# Patient Record
Sex: Female | Born: 1990 | Race: Black or African American | Hispanic: No | Marital: Single | Smoking: Never smoker
Health system: Southern US, Community
[De-identification: ages and names within clinical notes are randomized; demographics above are authoritative.]

## PROBLEM LIST (undated history)

## (undated) DIAGNOSIS — M1990 Unspecified osteoarthritis, unspecified site: Secondary | ICD-10-CM

## (undated) DIAGNOSIS — J189 Pneumonia, unspecified organism: Secondary | ICD-10-CM

## (undated) DIAGNOSIS — Z8739 Personal history of other diseases of the musculoskeletal system and connective tissue: Secondary | ICD-10-CM

## (undated) DIAGNOSIS — Z789 Other specified health status: Secondary | ICD-10-CM

## (undated) DIAGNOSIS — L732 Hidradenitis suppurativa: Secondary | ICD-10-CM

## (undated) DIAGNOSIS — G44209 Tension-type headache, unspecified, not intractable: Secondary | ICD-10-CM

## (undated) DIAGNOSIS — L709 Acne, unspecified: Secondary | ICD-10-CM

## (undated) HISTORY — DX: Personal history of other diseases of the musculoskeletal system and connective tissue: Z87.39

## (undated) HISTORY — DX: Tension-type headache, unspecified, not intractable: G44.209

## (undated) HISTORY — DX: Hidradenitis suppurativa: L73.2

## (undated) HISTORY — DX: Acne, unspecified: L70.9

---

## 2008-02-04 ENCOUNTER — Ambulatory Visit: Payer: Self-pay | Admitting: Family Medicine

## 2008-02-04 ENCOUNTER — Encounter (INDEPENDENT_AMBULATORY_CARE_PROVIDER_SITE_OTHER): Payer: Self-pay | Admitting: Nurse Practitioner

## 2008-02-04 LAB — CONVERTED CEMR LAB
ALT: 18 units/L (ref 0–35)
AST: 24 units/L (ref 0–37)
Albumin: 4.2 g/dL (ref 3.5–5.2)
Alkaline Phosphatase: 76 units/L (ref 39–117)
BUN: 10 mg/dL (ref 6–23)
Basophils Absolute: 0 10*3/uL (ref 0.0–0.1)
Basophils Relative: 0 % (ref 0–1)
CO2: 24 meq/L (ref 19–32)
Calcium: 9.5 mg/dL (ref 8.4–10.5)
Chloride: 103 meq/L (ref 96–112)
Creatinine, Ser: 0.74 mg/dL (ref 0.40–1.20)
Eosinophils Absolute: 0.2 10*3/uL (ref 0.0–0.7)
Eosinophils Relative: 3 % (ref 0–5)
Glucose, Bld: 79 mg/dL (ref 70–99)
HCT: 38 % (ref 36.0–46.0)
Hemoglobin: 11.9 g/dL — ABNORMAL LOW (ref 12.0–15.0)
Lymphocytes Relative: 28 % (ref 12–46)
Lymphs Abs: 2.5 10*3/uL (ref 0.7–4.0)
MCHC: 31.3 g/dL (ref 30.0–36.0)
MCV: 82.4 fL (ref 78.0–100.0)
Monocytes Absolute: 1 10*3/uL (ref 0.1–1.0)
Monocytes Relative: 11 % (ref 3–12)
Neutro Abs: 5.3 10*3/uL (ref 1.7–7.7)
Neutrophils Relative %: 58 % (ref 43–77)
Platelets: 485 10*3/uL — ABNORMAL HIGH (ref 150–400)
Potassium: 4.4 meq/L (ref 3.5–5.3)
RBC: 4.61 M/uL (ref 3.87–5.11)
RDW: 14.6 % (ref 11.5–15.5)
Sodium: 140 meq/L (ref 135–145)
TSH: 0.659 microintl units/mL (ref 0.350–5.50)
Total Bilirubin: 0.4 mg/dL (ref 0.3–1.2)
Total Protein: 7.9 g/dL (ref 6.0–8.3)
WBC: 9.1 10*3/uL (ref 4.0–10.5)

## 2008-10-07 ENCOUNTER — Emergency Department (HOSPITAL_COMMUNITY): Admission: EM | Admit: 2008-10-07 | Discharge: 2008-10-07 | Payer: Self-pay | Admitting: Emergency Medicine

## 2009-05-11 ENCOUNTER — Emergency Department (HOSPITAL_COMMUNITY): Admission: EM | Admit: 2009-05-11 | Discharge: 2009-05-11 | Payer: Self-pay | Admitting: Emergency Medicine

## 2009-07-28 ENCOUNTER — Inpatient Hospital Stay (HOSPITAL_COMMUNITY): Admission: AD | Admit: 2009-07-28 | Discharge: 2009-07-29 | Payer: Self-pay | Admitting: Obstetrics & Gynecology

## 2010-05-01 ENCOUNTER — Inpatient Hospital Stay (HOSPITAL_COMMUNITY): Admission: AD | Admit: 2010-05-01 | Discharge: 2010-05-02 | Payer: Self-pay | Admitting: Obstetrics & Gynecology

## 2010-05-01 ENCOUNTER — Ambulatory Visit: Payer: Self-pay | Admitting: Obstetrics and Gynecology

## 2010-05-27 ENCOUNTER — Inpatient Hospital Stay (HOSPITAL_COMMUNITY): Admission: AD | Admit: 2010-05-27 | Discharge: 2010-05-27 | Payer: Self-pay | Admitting: Family Medicine

## 2010-05-27 ENCOUNTER — Ambulatory Visit: Payer: Self-pay | Admitting: Nurse Practitioner

## 2010-12-01 ENCOUNTER — Inpatient Hospital Stay (HOSPITAL_COMMUNITY)
Admission: AD | Admit: 2010-12-01 | Discharge: 2010-12-03 | Payer: Self-pay | Source: Home / Self Care | Attending: Obstetrics | Admitting: Obstetrics

## 2010-12-01 ENCOUNTER — Encounter (INDEPENDENT_AMBULATORY_CARE_PROVIDER_SITE_OTHER): Payer: Self-pay | Admitting: Obstetrics

## 2010-12-10 LAB — RPR: RPR Ser Ql: NONREACTIVE

## 2010-12-10 LAB — CBC
HCT: 32.1 % — ABNORMAL LOW (ref 36.0–46.0)
HCT: 26.4 % — ABNORMAL LOW (ref 36.0–46.0)
Hemoglobin: 10.7 g/dL — ABNORMAL LOW (ref 12.0–15.0)
Hemoglobin: 8.5 g/dL — ABNORMAL LOW (ref 12.0–15.0)
MCH: 25.3 pg — ABNORMAL LOW (ref 26.0–34.0)
MCH: 24.6 pg — ABNORMAL LOW (ref 26.0–34.0)
MCHC: 33.3 g/dL (ref 30.0–36.0)
MCHC: 31.8 g/dL (ref 30.0–36.0)
MCV: 75.9 fL — ABNORMAL LOW (ref 78.0–100.0)
MCV: 77.4 fL — ABNORMAL LOW (ref 78.0–100.0)
Platelets: 373 10*3/uL (ref 150–400)
Platelets: 315 10*3/uL (ref 150–400)
RBC: 4.23 MIL/uL (ref 3.87–5.11)
RBC: 3.41 MIL/uL — ABNORMAL LOW (ref 3.87–5.11)
RDW: 15.3 % (ref 11.5–15.5)
RDW: 15.4 % (ref 11.5–15.5)
WBC: 12.7 10*3/uL — ABNORMAL HIGH (ref 4.0–10.5)
WBC: 12.6 10*3/uL — ABNORMAL HIGH (ref 4.0–10.5)

## 2011-02-10 LAB — WET PREP, GENITAL
Clue Cells Wet Prep HPF POC: NONE SEEN
Trich, Wet Prep: NONE SEEN

## 2011-02-11 LAB — POCT PREGNANCY, URINE: Preg Test, Ur: POSITIVE

## 2011-03-01 LAB — WET PREP, GENITAL: Trich, Wet Prep: NONE SEEN

## 2011-03-01 LAB — GC/CHLAMYDIA PROBE AMP, GENITAL
Chlamydia, DNA Probe: NEGATIVE
GC Probe Amp, Genital: NEGATIVE

## 2011-03-04 LAB — POCT URINALYSIS DIP (DEVICE)
Bilirubin Urine: NEGATIVE
Glucose, UA: NEGATIVE mg/dL
Hgb urine dipstick: NEGATIVE
Ketones, ur: NEGATIVE mg/dL
Nitrite: POSITIVE — AB
Protein, ur: 30 mg/dL — AB
Specific Gravity, Urine: 1.025 (ref 1.005–1.030)
Urobilinogen, UA: 0.2 mg/dL (ref 0.0–1.0)
pH: 6 (ref 5.0–8.0)

## 2011-03-04 LAB — POCT PREGNANCY, URINE: Preg Test, Ur: NEGATIVE

## 2011-03-13 NOTE — Discharge Summary (Signed)
  NAME:  Kari Rodriguez, Kari Rodriguez           ACCOUNT NO.:  [id number]  MEDICAL RECORD NO.:  [id number]          PATIENT TYPE:  INP  LOCATION:  9108                          FACILITY:  [facility]  PHYSICIAN:  Kathreen Cosier, M.D.DATE OF BIRTH:  08/20/1991  DATE OF ADMISSION:  12/01/2010 DATE OF DISCHARGE:  12/03/2010                              DISCHARGE SUMMARY   The patient is a 20 year old primigravida, [facility] December 15, 2010, who was admitted on December 01, 2010, because of leaking membranes, contracting, and also breech presentation.  She underwent primary low transverse cesarean section and had a female, Apgars 9 and 9, weighing 6 pounds 11 ounces.  On day 1, the patient was doing fine.  On day 2, she was doing fine.  Her hemoglobin was 8.1.  Her white count 12.6, platelets 215. RPR nonreactive.  HIV negative.  The patient desired early discharge and was discharged on the second postoperative day on ferrous sulfate for anemia, Tylox for pain, to see me in 6 weeks.  DISCHARGE DIAGNOSIS:  Status post primary low transverse cesarean section at term because of breech presentation with ruptured membranes in active labor.          ______________________________ Kathreen Cosier, M.D.     BAM/MEDQ  D:  03/06/2011  T:  03/06/2011  Job:  [id number]  Electronically Signed by Francoise Ceo M.D. on 03/13/2011 07:11:03 AM

## 2012-07-16 ENCOUNTER — Encounter (HOSPITAL_COMMUNITY): Payer: Self-pay | Admitting: Emergency Medicine

## 2012-07-16 ENCOUNTER — Emergency Department (HOSPITAL_COMMUNITY)
Admission: EM | Admit: 2012-07-16 | Discharge: 2012-07-16 | Disposition: A | Discharge status: Home / Self Care | Payer: Self-pay | Attending: Emergency Medicine | Admitting: Emergency Medicine

## 2012-07-16 DIAGNOSIS — M549 Dorsalgia, unspecified: Secondary | ICD-10-CM | POA: Insufficient documentation

## 2012-07-16 DIAGNOSIS — S39012A Strain of muscle, fascia and tendon of lower back, initial encounter: Secondary | ICD-10-CM

## 2012-07-16 LAB — PREGNANCY, URINE: Preg Test, Ur: NEGATIVE

## 2012-07-16 LAB — URINALYSIS, ROUTINE W REFLEX MICROSCOPIC
Bilirubin Urine: NEGATIVE
Glucose, UA: NEGATIVE mg/dL
Hgb urine dipstick: NEGATIVE
Ketones, ur: NEGATIVE mg/dL
Leukocytes, UA: NEGATIVE
Nitrite: NEGATIVE
Protein, ur: NEGATIVE mg/dL
Specific Gravity, Urine: 1.014 (ref 1.005–1.030)
Urobilinogen, UA: 1 mg/dL (ref 0.0–1.0)
pH: 7 (ref 5.0–8.0)

## 2012-07-16 MED ORDER — IBUPROFEN 800 MG PO TABS: 800.0000 mg | ORAL_TABLET | Freq: Three times a day (TID) | ORAL | Status: DC

## 2012-07-16 MED ORDER — IBUPROFEN 400 MG PO TABS
800.0000 mg | ORAL_TABLET | Freq: Once | ORAL | Status: AC
  Administered 2012-07-16: 800 mg via ORAL
  Filled 2012-07-16: qty 2

## 2012-07-16 NOTE — ED Notes (Signed)
Pt c/o lower back pain x 2 weeks on right side; pt denies obvious injury

## 2012-07-16 NOTE — ED Provider Notes (Signed)
History  This chart was scribed for Glynn Octave, MD by Bennett Scrape. This patient was seen in room TR07C/TR07C and the patient's care was started at 11:59AM.  CSN: [id number]  Arrival date & time 07/16/12  1147   First MD Initiated Contact with Patient 07/16/12 1159      Chief Complaint  Patient presents with  . Back Pain    The history is provided by the patient. No language interpreter was used.    Kari Rodriguez is a 21 y.o. female who presents to the Emergency Department complaining of 2 weeks of gradual onset, non-changing, constant right lower back pain described as sharp. The pain is non-radiating and is worse with sudden movement and changes in positon. She denies any recent falls or injuries but reports that she does a fair amount of heavy lifting daily in picking up her child. She reports taking Doan's pain reliever and Bengay with mild  improvement in symptoms. She denies any urinary symptoms, abdominal pain, weakness, numbness, loss of bowels or bladder and CP as associated symptoms. she does not have a h/o chronic medical conditions. She denies smoking and alcohol use.     History reviewed. No pertinent past medical history.  History reviewed. No pertinent past surgical history.  History reviewed. No pertinent family history.  History  Substance Use Topics  . Smoking status: Never Smoker   . Smokeless tobacco: Not on file  . Alcohol Use: No    No OB history provided.  Review of Systems  A complete 10 system review of systems was obtained and all systems are negative except as noted in the HPI and PMH.   Allergies  Review of patient's allergies indicates no known allergies.  Home Medications   Current Outpatient Rx  Name Route Sig Dispense Refill  . NEXPLANON [place] Subcutaneous Inject into the skin once.    Marland Kitchen DOANS PILLS PO Oral Take 2 tablets by mouth once.      Triage Vitals: BP 147/77  Pulse 83  Temp 98.9 F (37.2 C) (Oral)  Resp 18  SpO2  100%  Physical Exam  Nursing note and vitals reviewed. Constitutional: She is oriented to person, place, and time. She appears well-developed and well-nourished. No distress.  HENT:  Head: Normocephalic and atraumatic.  Eyes: EOM are normal.  Neck: Neck supple. No tracheal deviation present.  Cardiovascular: Normal rate.   Pulmonary/Chest: Effort normal. No respiratory distress.  Abdominal: Soft. There is no tenderness.  Musculoskeletal: Normal range of motion.       Right para spinal tenderness to palpation, no midline tenderness, 5/5 strength, 2+ patellar reflexes bilaterally, ankle, dorsi and plantar flexion intact, great toe extension intact bilaterally  Neurological: She is alert and oriented to person, place, and time.  Skin: Skin is warm and dry.  Psychiatric: She has a normal mood and affect. Her behavior is normal.    ED Course  Procedures (including critical care time)  DIAGNOSTIC STUDIES: Oxygen Saturation is 100% on room air, normal by my interpretation.    COORDINATION OF CARE: 12:09PM-Discussed discharge plan which includes ibuprofen and motrin as needed with pt at bedside and pt agreed to plan. Also advised pt to use ice and heat as needed to help improve symptoms.  1:12PM-Informed pt of negative lab results and pt acknowledged these results. Discussed discharge plan with pt and pt agreed. Advised pt to avoid lifting more than10 lbs but to continue doing her daily activities.    Labs Reviewed  URINALYSIS, ROUTINE  W REFLEX MICROSCOPIC  PREGNANCY, URINE   No results found.   No diagnosis found.    MDM  Right paraspinal back pain that does not radiate. No midline tenderness, no neurological deficits or red flags.  Urinalysis negative, hCG negative. Suspect muscular skeletal strain from lifting injury. No neuro deficits or red flags, stable for followup with PCP.  I personally performed the services described in this documentation, which was scribed in my  presence.  The recorded information has been reviewed and considered.       Glynn Octave, MD 07/16/12 1322

## 2012-07-16 NOTE — ED Notes (Signed)
Pt reports no lifting injury or fall.  Pt states she has pain in lumbar area and right flank area that has been present for approximately 2 weeks

## 2012-07-25 ENCOUNTER — Emergency Department (HOSPITAL_COMMUNITY)
Admission: EM | Admit: 2012-07-25 | Discharge: 2012-07-25 | Disposition: A | Discharge status: Home / Self Care | Payer: Self-pay | Attending: Emergency Medicine | Admitting: Emergency Medicine

## 2012-07-25 ENCOUNTER — Encounter (HOSPITAL_COMMUNITY): Payer: Self-pay | Admitting: Emergency Medicine

## 2012-07-25 DIAGNOSIS — M549 Dorsalgia, unspecified: Secondary | ICD-10-CM | POA: Insufficient documentation

## 2012-07-25 MED ORDER — HYDROCODONE-ACETAMINOPHEN 5-325 MG PO TABS: 1.0000 | ORAL_TABLET | ORAL | Status: AC | PRN

## 2012-07-25 MED ORDER — IBUPROFEN 600 MG PO TABS: 600.0000 mg | ORAL_TABLET | Freq: Four times a day (QID) | ORAL | Status: AC | PRN

## 2012-07-25 MED ORDER — CYCLOBENZAPRINE HCL 10 MG PO TABS: 10.0000 mg | ORAL_TABLET | Freq: Two times a day (BID) | ORAL | Status: AC | PRN

## 2012-07-25 MED ORDER — IBUPROFEN 400 MG PO TABS
600.0000 mg | ORAL_TABLET | Freq: Once | ORAL | Status: AC
  Administered 2012-07-25: 600 mg via ORAL
  Filled 2012-07-25: qty 1

## 2012-07-25 NOTE — ED Notes (Signed)
Pt c/o back pain ongoing since Aug 12th. Pt seen here last week for same and given ibuprofen without relief. Pt denies urinary symptoms.

## 2012-07-25 NOTE — ED Provider Notes (Signed)
History  This chart was scribed for Derwood Kaplan, MD by Erskine Emery. This patient was seen in room TR04C/TR04C and the patient's care was started at 12:30.    CSN: [id number]  Arrival date & time 07/25/12  1105   None     Chief Complaint  Patient presents with  . Back Pain    (Consider location/radiation/quality/duration/timing/severity/associated sxs/prior treatment) The history is provided by the patient. No language interpreter was used.  Kari Rodriguez is a 21 y.o. female who presents to the Emergency Department complaining of gradually worsening back pain in the middle low back with no radiation for the past 2 weeks (since August 12th). Pt denies any known injury to the area or h/o back pain. Pt reports the pain is aggravated by turning and moving, creating a sharp pain, and only mildly relieved by her prescribed 800 mg of Ibuprofen. Pt reports taking a family member's muscle relaxer last night on top of her Ibuprofen with no relief from pain. Pt denies any urinary difficulty, dysuria, or numbness and tingling in her legs, but does report occasional hand numbness when writing.  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 Pt drove herself here.   History reviewed. No pertinent past medical history.  Past Surgical History  Procedure Date  . Cesarean section     History reviewed. No pertinent family history.  History  Substance Use Topics  . Smoking status: Never Smoker   . Smokeless tobacco: Not on file  . Alcohol Use: No    OB History    Grav Para Term Preterm Abortions TAB SAB Ect Mult Living                  Review of Systems  Constitutional: Negative for fever and chills.  Respiratory: Negative for shortness of breath.   Gastrointestinal: Negative for nausea and vomiting.  Genitourinary: Negative for dysuria and difficulty urinating.  Musculoskeletal: Positive for back pain.  Neurological: Negative for weakness and numbness.     Allergies  Review of patient's allergies indicates no known allergies.  Home Medications   Current Outpatient Rx  Name Route Sig Dispense Refill  . CYCLOBENZAPRINE HCL 10 MG PO TABS Oral Take 10 mg by mouth once as needed. For pain    . NEXPLANON [place] Subcutaneous Inject into the skin once.    . IBUPROFEN 800 MG PO TABS Oral Take 800 mg by mouth 3 (three) times daily.      BP 124/75  Pulse 67  Temp 98.8 F (37.1 C) (Oral)  Resp 18  SpO2 97%  Physical Exam  Nursing note and vitals reviewed. Constitutional: She is oriented to person, place, and time. She appears well-developed and well-nourished. No distress.  HENT:  Head: Normocephalic and atraumatic.  Eyes: EOM are normal.  Neck: Neck supple. No tracheal deviation present.  Cardiovascular: Normal rate, regular rhythm and normal heart sounds.   Pulmonary/Chest: Effort normal and breath sounds normal. No respiratory distress. She has no wheezes.       Clear to auscultation  Abdominal: Soft. There is no tenderness.  Musculoskeletal: Normal range of motion.       Tender from L3 to L5/S1 with mild spasming present. No CVA tenderness. No tenderness on the sides. Positive straight leg raise on the right lower extremity.   Neurological: She is alert and oriented to person, place, and time.  Skin: Skin is warm and dry.  Psychiatric: She has a normal mood and  affect. Her behavior is normal.    ED Course  Procedures (including critical care time) DIAGNOSTIC STUDIES: Oxygen Saturation is 97% on room air, adequate by my interpretation.    COORDINATION OF CARE: 12: 30--I evaluated the patient and we discussed a treatment plan including Ibuprofen (600 mg), Norco, and muscle relaxers to which the pt agreed. I suggested the pt alternate using ice and heat, be careful when lifting anything, and pay attention to her posture.   Labs Reviewed - No data to display No results found.   No diagnosis found.    MDM   DDx includes: -  DJD of the back - Spondylitises/ spondylosis - Sciatica - Musculoskeletal pain - Muscle spasms  Young woman, with back pain. No associated numbness, weakness, urinary incontinence, urinary retention, bowel incontinence, weakness. No risk factors for serious conditions in the spinal cord or lytic lesions of the back. Will give pain meds.      Medical screening examination/treatment/procedure(s) were conducted by me as a physician. Scribe utilized for documentation purposes only.   Derwood Kaplan, MD 07/25/12 1259

## 2012-12-28 ENCOUNTER — Encounter (HOSPITAL_COMMUNITY): Payer: Self-pay

## 2012-12-28 ENCOUNTER — Emergency Department (HOSPITAL_COMMUNITY)
Admission: EM | Admit: 2012-12-28 | Discharge: 2012-12-28 | Disposition: A | Discharge status: Home / Self Care | Payer: BC Managed Care – PPO | Source: Home / Self Care

## 2012-12-28 DIAGNOSIS — S50869A Insect bite (nonvenomous) of unspecified forearm, initial encounter: Secondary | ICD-10-CM

## 2012-12-28 DIAGNOSIS — IMO0001 Reserved for inherently not codable concepts without codable children: Secondary | ICD-10-CM

## 2012-12-28 DIAGNOSIS — W57XXXA Bitten or stung by nonvenomous insect and other nonvenomous arthropods, initial encounter: Secondary | ICD-10-CM

## 2012-12-28 MED ORDER — PREDNISONE 20 MG PO TABS: ORAL_TABLET | ORAL | Status: DC

## 2012-12-28 MED ORDER — TRIAMCINOLONE ACETONIDE 0.1 % EX CREA: TOPICAL_CREAM | Freq: Two times a day (BID) | CUTANEOUS | Status: DC

## 2012-12-28 NOTE — ED Notes (Signed)
Patient c/o possible insect bite or rash on right arm and left leg, states started approx 2 weeks ago comes and goes, has tried Benadryl and Cortisone cream with some relief but the spots always return

## 2012-12-28 NOTE — ED Provider Notes (Signed)
History     CSN: [id number]  Arrival date & time 12/28/12  1820   None     Chief Complaint  Patient presents with  . Rash    (Consider location/radiation/quality/duration/timing/severity/associated sxs/prior treatment) HPI Comments: For the past couple of days this patient has noticed bumps on her body. It starts out as a small red raised area with moderate to severe itching. She was applied OTC cortisone cream and taken Benadryl in detail temporarily. Today she has only 2 lesions. The largest lesion is on the right forearm measuring 5 x 4 cm. This lesion is raised, red and mildly indurated. She denies systemic symptoms. Previous lesions have been smaller have resolved spontaneously.   History reviewed. No pertinent past medical history.  Past Surgical History  Procedure Date  . Cesarean section     No family history on file.  History  Substance Use Topics  . Smoking status: Never Smoker   . Smokeless tobacco: Not on file  . Alcohol Use: No    OB History    Grav Para Term Preterm Abortions TAB SAB Ect Mult Living                  Review of Systems  Constitutional: Negative.   Respiratory: Negative.   Cardiovascular: Negative.   Gastrointestinal: Negative.   Musculoskeletal: Negative.   Skin:       As per history of present illness    Allergies  Review of patient's allergies indicates no known allergies.  Home Medications   Current Outpatient Rx  Name  Route  Sig  Dispense  Refill  . NEXPLANON [place]   Subcutaneous   Inject into the skin once.         . CYCLOBENZAPRINE HCL 10 MG PO TABS   Oral   Take 10 mg by mouth once as needed. For pain         . PREDNISONE 20 MG PO TABS      2 tabs po once daily x 3 days   6 tablet   0   . TRIAMCINOLONE ACETONIDE 0.1 % EX CREA   Topical   Apply topically 2 (two) times daily.   30 g   0     BP 124/85  Pulse 78  Temp 98.6 F (37 C) (Oral)  Resp 16  SpO2 98%  Physical Exam  Constitutional: She is  oriented to person, place, and time. She appears well-developed and well-nourished. No distress.  HENT:  Mouth/Throat: Oropharynx is clear and moist.  Eyes: Conjunctivae normal and EOM are normal.  Neck: Normal range of motion. Neck supple.  Cardiovascular: Normal rate and normal heart sounds.   Pulmonary/Chest: Effort normal and breath sounds normal.  Musculoskeletal: She exhibits no edema and no tenderness.  Neurological: She is alert and oriented to person, place, and time.  Skin: Skin is warm and dry.       Lesion on the right forearm as described in history of present illness. Much smaller lesion on the left leg approximately 0.5 cm in diameter. There is no lymphangitis. Mildly increased warmth of the forearm.  Psychiatric: She has a normal mood and affect.    ED Course  Procedures (including critical care time)  Labs Reviewed - No data to display No results found.   1. Insect bite of forearm       MDM  Triamcinolone cream to the affected areas twice a day Prednisone 20 mg daily for 3 day Continue the Benadryl every  4-6 hours as needed for itching Or new symptoms problems or worsening may return.         Hayden Rasmussen, NP 12/28/12 2009

## 2012-12-29 NOTE — ED Provider Notes (Signed)
Medical screening examination/treatment/procedure(s) were performed by resident physician or non-physician practitioner and as supervising physician I was immediately available for consultation/collaboration.   Lovenia Debruler DOUGLAS MD.    Mandrell Vangilder D Wrenley Sayed, MD 12/29/12 1307 

## 2013-04-27 ENCOUNTER — Other Ambulatory Visit (HOSPITAL_COMMUNITY)
Admission: RE | Admit: 2013-04-27 | Discharge: 2013-04-27 | Disposition: A | Discharge status: Home / Self Care | Payer: BC Managed Care – PPO | Source: Ambulatory Visit | Attending: Obstetrics and Gynecology | Admitting: Obstetrics and Gynecology

## 2013-04-27 ENCOUNTER — Other Ambulatory Visit: Payer: Self-pay | Admitting: Obstetrics and Gynecology

## 2013-04-27 DIAGNOSIS — Z113 Encounter for screening for infections with a predominantly sexual mode of transmission: Secondary | ICD-10-CM | POA: Insufficient documentation

## 2013-04-27 DIAGNOSIS — Z01419 Encounter for gynecological examination (general) (routine) without abnormal findings: Secondary | ICD-10-CM | POA: Insufficient documentation

## 2014-05-02 ENCOUNTER — Encounter (HOSPITAL_COMMUNITY): Payer: Self-pay | Admitting: Emergency Medicine

## 2014-05-02 ENCOUNTER — Emergency Department (HOSPITAL_COMMUNITY)
Admission: EM | Admit: 2014-05-02 | Discharge: 2014-05-02 | Disposition: A | Discharge status: Home / Self Care | Payer: BC Managed Care – PPO | Attending: Emergency Medicine | Admitting: Emergency Medicine

## 2014-05-02 DIAGNOSIS — IMO0002 Reserved for concepts with insufficient information to code with codable children: Secondary | ICD-10-CM | POA: Insufficient documentation

## 2014-05-02 DIAGNOSIS — L732 Hidradenitis suppurativa: Secondary | ICD-10-CM | POA: Insufficient documentation

## 2014-05-02 MED ORDER — SULFAMETHOXAZOLE-TRIMETHOPRIM 800-160 MG PO TABS: 2.0000 | ORAL_TABLET | Freq: Two times a day (BID) | ORAL | Status: DC

## 2014-05-02 NOTE — ED Notes (Signed)
Signature pad not working. Patient verbalizes discharge instructions.

## 2014-05-02 NOTE — ED Provider Notes (Signed)
CSN: [id number]     Arrival date & time 05/02/14  1452 History  This chart was scribed for non-physician practitioner, Renne CriglerJoshua Eimy Plaza, PA-C, working with Audree CamelScott T Goldston, MD by Shari HeritageAisha Amuda, ED Scribe. This patient was seen in room WTR5/WTR5 and the patient's care was started at 3:48 PM.   Chief Complaint  Patient presents with  . Abscess    The history is provided by the patient. No language interpreter was used.    HPI Comments: Kari Rodriguez is a 23 y.o. female who presents to the Emergency Department complaining of boils to both axilla for multiple months. Patient states that they have opened up and have been draining blood and foul smelling purulent drainage for the past 3 months. She has tried applying antibiotic creams and other OTC ointments to the area without symptom improvement. Patient has a history of recurrent skin infections and these also run in her family. She reports that her aunt had to have an abscess surgically removed due to diabetic related complications. Patient has no personal history of DM. She denies associated fever, chills, nausea, or vomiting. She has no other chronic medical conditions.   History reviewed. No pertinent past medical history. Past Surgical History  Procedure Laterality Date  . Cesarean section     No family history on file. History  Substance Use Topics  . Smoking status: Never Smoker   . Smokeless tobacco: Not on file  . Alcohol Use: No   OB History   Grav Para Term Preterm Abortions TAB SAB Ect Mult Living                 Review of Systems  Constitutional: Negative for fever and chills.  Gastrointestinal: Negative for nausea and vomiting.  Skin: Positive for wound (boils to bilateral axilla). Negative for color change.       Positive for abscess.  Hematological: Negative for adenopathy.    Allergies  Review of patient's allergies indicates no known allergies.  Home Medications   Prior to Admission medications    Medication Sig Start Date End Date Taking? Authorizing Provider  cyclobenzaprine (FLEXERIL) 10 MG tablet Take 10 mg by mouth once as needed. For pain    Historical Provider, MD  Etonogestrel [facility](NEXPLANON [place]) Inject into the skin once.    Historical Provider, MD  predniSONE (DELTASONE) 20 MG tablet 2 tabs po once daily x 3 days 12/28/12   Hayden Rasmussenavid Mabe, NP  triamcinolone cream (KENALOG) 0.1 % Apply topically 2 (two) times daily. 12/28/12   Hayden Rasmussenavid Mabe, NP    Physical Exam  Nursing note and vitals reviewed. Constitutional: She appears well-developed and well-nourished. No distress.  HENT:  Head: Normocephalic and atraumatic.  Eyes: Conjunctivae and EOM are normal.  Neck: Normal range of motion. No tracheal deviation present.  Cardiovascular: Normal rate.   Pulmonary/Chest: Effort normal. No respiratory distress.  Musculoskeletal: Normal range of motion.  Neurological: She is alert.  Skin: Skin is warm and dry.  Multiple very small abscesses to left and right axilla with minimal drainage. Small amount of pus expressed from the small abscess in right axilla. There are no large areas of fluctuance that required drainage today. Overall, clinical picture is consistent with hidradenitis suppurativa.   Psychiatric: She has a normal mood and affect. Her behavior is normal.    ED Course  Procedures (including critical care time) DIAGNOSTIC STUDIES: Oxygen Saturation is 100% on room air, normal by my interpretation.    COORDINATION OF CARE: 3:53 PM- Patient  informed of current plan for treatment and evaluation and agrees with plan at this time.  BP 131/77  Pulse 76  Temp(Src) 97.8 F (36.6 C) (Oral)  Resp 18  SpO2 100%  LMP 04/11/2014  Will treat patient with oral antibiotics. Discussed hidradenitis and that the symptoms could be recurrent and chronic. Recommend that she followup with Gen. surgery for further evaluation and management suggestions and advice.  The patient was urged to return to  the Emergency Department urgently with worsening pain, swelling, expanding erythema especially if it streaks away from the affected area, fever, or if they have any other concerns.   The patient was urged to return to the Emergency Department or go to their PCP in 48 hours for wound recheck if the area is not significantly improved.  The patient verbalized understanding and stated agreement with this plan.     MDM   Final diagnoses:  Hidradenitis suppurativa   Patient with symptoms x3 months. No previous diagnosis of hidradenitis however clinically her exam supports this. Feel course of antibiotics is warranted due to severity and duration of symptoms. Surgical followup given. No fever or other systemic symptoms.  I personally performed the services described in this documentation, which was scribed in my presence. The recorded information has been reviewed and is accurate.    Renne Crigler, PA-C 05/02/14 [phone]

## 2014-05-02 NOTE — Discharge Instructions (Signed)
Please read and follow all provided instructions.  Your diagnoses today include:  1. Hidradenitis suppurativa     Tests performed today include:  Vital signs. See below for your results today.   Medications prescribed:   Bactrim (trimethoprim/sulfamethoxazole) - antibiotic  You have been prescribed an antibiotic medicine: take the entire course of medicine even if you are feeling better. Stopping early can cause the antibiotic not to work.  Take any prescribed medications only as directed.   Home care instructions:   Follow any educational materials contained in this packet  Follow-up instructions: Please follow-up with your primary care provider or the surgeon listed in the next 1 week for further evaluation of your symptoms. If you do not have a primary care doctor -- see below for referral information.   Return instructions:  Return to the Emergency Department if you have:  Fever  Worsening symptoms  Worsening pain  Worsening swelling  Redness of the skin that moves away from the affected area, especially if it streaks away from the affected area   Any other emergent concerns  Your vital signs today were: BP 131/77   Pulse 76   Temp(Src) 97.8 F (36.6 C) (Oral)   Resp 18   SpO2 100%   LMP 04/11/2014 If your blood pressure (BP) was elevated above 135/85 this visit, please have this repeated by your doctor within one month. --------------

## 2014-05-02 NOTE — ED Notes (Signed)
Pt c/o bilateral abscess under both arms. Pt states they are open and draining blood and also have a bad smell.

## 2014-05-03 NOTE — ED Provider Notes (Signed)
Medical screening examination/treatment/procedure(s) were performed by non-physician practitioner and as supervising physician I was immediately available for consultation/collaboration.   EKG Interpretation None        Chace Klippel T Sinahi Knights, MD 05/03/14 0050 

## 2014-07-13 ENCOUNTER — Ambulatory Visit (INDEPENDENT_AMBULATORY_CARE_PROVIDER_SITE_OTHER): Payer: BC Managed Care – PPO | Admitting: General Surgery

## 2014-07-28 ENCOUNTER — Ambulatory Visit (INDEPENDENT_AMBULATORY_CARE_PROVIDER_SITE_OTHER): Payer: BC Managed Care – PPO | Admitting: General Surgery

## 2016-02-01 ENCOUNTER — Other Ambulatory Visit: Payer: Self-pay | Admitting: Nurse Practitioner

## 2016-02-01 ENCOUNTER — Other Ambulatory Visit (HOSPITAL_COMMUNITY)
Admission: RE | Admit: 2016-02-01 | Discharge: 2016-02-01 | Disposition: A | Discharge status: Home / Self Care | Payer: BLUE CROSS/BLUE SHIELD | Source: Ambulatory Visit | Attending: Nurse Practitioner | Admitting: Nurse Practitioner

## 2016-02-01 DIAGNOSIS — Z113 Encounter for screening for infections with a predominantly sexual mode of transmission: Secondary | ICD-10-CM | POA: Diagnosis present

## 2016-02-01 DIAGNOSIS — Z01419 Encounter for gynecological examination (general) (routine) without abnormal findings: Secondary | ICD-10-CM | POA: Insufficient documentation

## 2016-02-02 LAB — CYTOLOGY - PAP

## 2016-12-26 HISTORY — PX: OTHER SURGICAL HISTORY: SHX169

## 2017-01-17 ENCOUNTER — Other Ambulatory Visit: Payer: Self-pay | Admitting: General Surgery

## 2017-05-11 ENCOUNTER — Emergency Department (HOSPITAL_COMMUNITY)
Admission: EM | Admit: 2017-05-11 | Discharge: 2017-05-11 | Disposition: A | Discharge status: Home / Self Care | Payer: BLUE CROSS/BLUE SHIELD | Attending: Emergency Medicine | Admitting: Emergency Medicine

## 2017-05-11 ENCOUNTER — Encounter (HOSPITAL_COMMUNITY): Payer: Self-pay | Admitting: Emergency Medicine

## 2017-05-11 ENCOUNTER — Encounter (HOSPITAL_COMMUNITY): Payer: Self-pay | Admitting: Nurse Practitioner

## 2017-05-11 DIAGNOSIS — L02412 Cutaneous abscess of left axilla: Secondary | ICD-10-CM | POA: Insufficient documentation

## 2017-05-11 DIAGNOSIS — Z79899 Other long term (current) drug therapy: Secondary | ICD-10-CM | POA: Insufficient documentation

## 2017-05-11 DIAGNOSIS — Z5321 Procedure and treatment not carried out due to patient leaving prior to being seen by health care provider: Secondary | ICD-10-CM | POA: Insufficient documentation

## 2017-05-11 DIAGNOSIS — M7989 Other specified soft tissue disorders: Secondary | ICD-10-CM | POA: Diagnosis present

## 2017-05-11 MED ORDER — SULFAMETHOXAZOLE-TRIMETHOPRIM 800-160 MG PO TABS: 1.0000 | ORAL_TABLET | Freq: Two times a day (BID) | ORAL | 0 refills | Status: AC

## 2017-05-11 MED ORDER — LIDOCAINE-EPINEPHRINE (PF) 2 %-1:200000 IJ SOLN
10.0000 mL | Freq: Once | INTRAMUSCULAR | Status: AC
  Administered 2017-05-11: 10 mL
  Filled 2017-05-11: qty 20

## 2017-05-11 NOTE — ED Triage Notes (Signed)
Pt states she has a condition that causes her to get frequent abscess. She noticed a few days ago she began developing one in her left axillary that is now over the past 24hrs more painful and causing discomfort. Denies any fever or malaise. States her provider generally has to drain or do some form of surgical procedure on them at times.

## 2017-05-11 NOTE — ED Provider Notes (Signed)
[facility]-EMERGENCY DEPT Provider Note   CSN: [id number] Arrival date & time: 05/11/17  1134  By signing my name below, I, Kari Rodriguez, attest that this documentation has been prepared under the direction and in the presence of [employer], PA-C. Electronically Signed: Linna Rodriguez, Scribe. 05/11/2017. 12:40 PM.  History   Chief Complaint Chief Complaint  Patient presents with  . Recurrent Skin Infections   The history is provided by the patient. No language interpreter was used.    HPI Comments: Kari Rodriguez is a 26 y.o. female who presents to the Emergency Department complaining of a moderate, gradually worsening area of pain and swelling to her left axilla for about one week. She states the area has been draining spontaneously. Patient has a history of hidradenitis suppurativa and states her current symptoms are consistent with this condition. She states she has had sweat gland excision surgery on her right axilla and has not had any abscesses to this area since the operation. Patient notes she intends to have the same surgery on her left axilla but has been unable to arrange it due to her busy schedule. NKDA. She denies fevers, chills, nausea, vomiting, or any other associated symptoms.  History reviewed. No pertinent past medical history.  There are no active problems to display for this patient.   Past Surgical History:  Procedure Laterality Date  . CESAREAN SECTION    . sweat gland removed Right 12/2016    OB History    No data available       Home Medications    Prior to Admission medications   Medication Sig Start Date End Date Taking? Authorizing Provider  cyclobenzaprine (FLEXERIL) 10 MG tablet Take 10 mg by mouth once as needed. For pain    [provider]  Etonogestrel (NEXPLANON [place]) Inject into the skin once.    [provider]  predniSONE (DELTASONE) 20 MG tablet 2 tabs po once daily x 3 days 12/28/12   Hayden Rasmussen, NP    sulfamethoxazole-trimethoprim (BACTRIM DS,SEPTRA DS) 800-160 MG tablet Take 1 tablet by mouth 2 (two) times daily. 05/11/17 05/18/17  Emi Holes, PA-C  triamcinolone cream (KENALOG) 0.1 % Apply topically 2 (two) times daily. 12/28/12   Hayden Rasmussen, NP    Family History No family history on file.  Social History Social History  Substance Use Topics  . Smoking status: Never Smoker  . Smokeless tobacco: Never Used  . Alcohol use No     Allergies   Patient has no known allergies.   Review of Systems Review of Systems  Constitutional: Negative for chills and fever.  Gastrointestinal: Negative for nausea and vomiting.  Skin: Positive for wound.   Physical Exam Updated Vital Signs BP 113/88 (BP Location: Right Arm)   Pulse 72   Temp 98.2 F (36.8 C) (Oral)   Resp 20   Ht 5\' 11"  (1.803 m)   Wt (!) 136.5 kg (301 lb)   SpO2 99%   BMI 41.98 kg/m   Physical Exam  Constitutional: She appears well-developed and well-nourished. No distress.  HENT:  Head: Normocephalic and atraumatic.  Mouth/Throat: Oropharynx is clear and moist. No oropharyngeal exudate.  Eyes: Conjunctivae are normal. Pupils are equal, round, and reactive to light. Right eye exhibits no discharge. Left eye exhibits no discharge. No scleral icterus.  Neck: Normal range of motion. Neck supple. No thyromegaly present.  Cardiovascular: Normal rate, regular rhythm, normal heart sounds and intact distal pulses.  Exam reveals no gallop and no  friction rub.   No murmur heard. Pulmonary/Chest: Effort normal and breath sounds normal. No stridor. No respiratory distress. She has no wheezes. She has no rales.  Abdominal: Soft. Bowel sounds are normal. She exhibits no distension. There is no tenderness. There is no rebound and no guarding.  Musculoskeletal: She exhibits no edema.  Lymphadenopathy:    She has no cervical adenopathy.  Neurological: She is alert. Coordination normal.  Skin: Skin is warm and dry. No rash  noted. She is not diaphoretic. No pallor.  Left axilla: 5 x 2 cm area of fluctuance and tenderness surrounded by 8 x 2 cm area of induration. Surrounding scars.  Psychiatric: She has a normal mood and affect.  Nursing note and vitals reviewed.  ED Treatments / Results  Labs (all labs ordered are listed, but only abnormal results are displayed) Labs Reviewed - No data to display  EKG  EKG Interpretation None       Radiology No results found.  Procedures .Marland KitchenIncision and Drainage Date/Time: 05/11/2017 1:54 PM Performed by: Emi Holes Authorized by: Emi Holes   Consent:    Consent obtained:  Verbal   Consent given by:  Patient Location:    Type:  Abscess   Location:  Upper extremity   Upper extremity location:  Arm   Arm location:  L upper arm Pre-procedure details:    Skin preparation:  Betadine Anesthesia (see MAR for exact dosages):    Anesthesia method:  Local infiltration   Local anesthetic:  Lidocaine 2% WITH epi Procedure type:    Complexity:  Simple Procedure details:    Incision types:  Single straight   Incision depth:  Dermal   Wound management:  Probed and deloculated   Drainage:  Purulent   Drainage amount:  Copious   Packing materials:  None Post-procedure details:    Patient tolerance of procedure:  Tolerated well, no immediate complications    (including critical care time)  DIAGNOSTIC STUDIES: Oxygen Saturation is 99% on RA, normal by my interpretation.    COORDINATION OF CARE: 12:36 PM Discussed treatment plan with pt at bedside and pt agreed to plan.  Medications Ordered in ED Medications  lidocaine-EPINEPHrine (XYLOCAINE W/EPI) 2 %-1:200000 (PF) injection 10 mL (10 mLs Infiltration Given 05/11/17 1312)     Initial Impression / Assessment and Plan / ED Course  I have reviewed the triage vital signs and the nursing notes.  Pertinent labs & imaging results that were available during my care of the patient were reviewed by me  and considered in my medical decision making (see chart for details).     Patient with skin abscess. Incision and drainage performed in the ED today.  Abscess was not large enough to warrant packing or drain placement. Wound recheck in 2 days. Supportive care discussed.  Pt sent home with Bactrim. Patient denies possibility of being pregnant. Patient advised to follow-up with her surgeon for further evaluation and treatment. Sooner return precautions discussed. Patient understands and agrees with plan. Patient vitals stable throughout ED course and discharged in satisfactory condition.   Final Clinical Impressions(s) / ED Diagnoses   Final diagnoses:  Abscess of left axilla    New Prescriptions Discharge Medication List as of 05/11/2017  2:39 PM     I personally performed the services described in this documentation, which was scribed in my presence. The recorded information has been reviewed and is accurate.    Emi Holes, PA-C 05/11/17 1628    Jacalyn Lefevre,  MD 05/11/17 1636

## 2017-05-11 NOTE — Discharge Instructions (Signed)
Medications: Bactrim  Treatment: Take Bactrim twice daily for 7 days. Make sure to finish of this medication. Leave the dressing on until later tonight and wash with warm soapy water. Replace with clean dressing. Change dressing daily.  Follow-up: Please return to the emergency department or see your surgeon in 2 days for wound recheck. Please return sooner if you develop any new or worsening symptoms including fever, increasing pain, redness, swelling, red streaking from the wound.

## 2017-05-11 NOTE — ED Notes (Signed)
Pt gave her stickers back to registration and told them she did not want to wait any longer.  Will d/c [facility].

## 2017-05-11 NOTE — ED Triage Notes (Signed)
Patient c/o boil on left side of armpit area x week but has gotten bigger, more painful, and starting to look "like starting to drain".

## 2017-08-01 ENCOUNTER — Other Ambulatory Visit: Payer: Self-pay | Admitting: General Surgery

## 2018-04-28 ENCOUNTER — Emergency Department (HOSPITAL_COMMUNITY)
Admission: EM | Admit: 2018-04-28 | Discharge: 2018-04-28 | Disposition: A | Discharge status: Home / Self Care | Payer: 59 | Attending: Emergency Medicine | Admitting: Emergency Medicine

## 2018-04-28 ENCOUNTER — Encounter (HOSPITAL_COMMUNITY): Payer: Self-pay | Admitting: Emergency Medicine

## 2018-04-28 DIAGNOSIS — Y929 Unspecified place or not applicable: Secondary | ICD-10-CM | POA: Insufficient documentation

## 2018-04-28 DIAGNOSIS — Z79899 Other long term (current) drug therapy: Secondary | ICD-10-CM | POA: Insufficient documentation

## 2018-04-28 DIAGNOSIS — Y999 Unspecified external cause status: Secondary | ICD-10-CM | POA: Diagnosis not present

## 2018-04-28 DIAGNOSIS — M62830 Muscle spasm of back: Secondary | ICD-10-CM

## 2018-04-28 DIAGNOSIS — S39012A Strain of muscle, fascia and tendon of lower back, initial encounter: Secondary | ICD-10-CM | POA: Diagnosis present

## 2018-04-28 DIAGNOSIS — X58XXXA Exposure to other specified factors, initial encounter: Secondary | ICD-10-CM | POA: Insufficient documentation

## 2018-04-28 DIAGNOSIS — Y939 Activity, unspecified: Secondary | ICD-10-CM | POA: Diagnosis not present

## 2018-04-28 MED ORDER — NAPROXEN 500 MG PO TABS: 500.0000 mg | ORAL_TABLET | Freq: Two times a day (BID) | ORAL | 0 refills | Status: DC

## 2018-04-28 MED ORDER — METHOCARBAMOL 750 MG PO TABS: 750.0000 mg | ORAL_TABLET | Freq: Two times a day (BID) | ORAL | 0 refills | Status: DC

## 2018-04-28 MED ORDER — ACETAMINOPHEN 500 MG PO TABS: 500.0000 mg | ORAL_TABLET | Freq: Four times a day (QID) | ORAL | 0 refills | Status: DC | PRN

## 2018-04-28 NOTE — ED Notes (Signed)
[facility]. Reports she has been using a machine there and has been having right lower back pain that started on Sunday night.

## 2018-04-28 NOTE — Discharge Instructions (Signed)
Medications: Robaxin, Naprosyn, Tylenol  Treatment: Take Robaxin twice daily as needed for muscle pain or spasms.  Do not drive or operate machinery while taking this medication.  Take Naprosyn twice daily alternating with Tylenol, as needed for further pain control.  Do not combine Naprosyn with other NSAID medication such as ibuprofen, Motrin, Advil, Aleve.  Use heat and ice alternating 20 minutes on, 20 minutes off.  After heat, attempt the exercises and stretches as tolerated.  Follow-up: Please follow-up with your primary care provider if your symptoms are not improving over the next week to 10 days.  Please return to the emergency department if you develop any new or worsening symptoms including complete numbness of your groin, legs, loss of bowel or bladder control.
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
## 2018-12-03 ENCOUNTER — Ambulatory Visit (INDEPENDENT_AMBULATORY_CARE_PROVIDER_SITE_OTHER): Payer: 59 | Admitting: Podiatry

## 2018-12-03 ENCOUNTER — Ambulatory Visit (INDEPENDENT_AMBULATORY_CARE_PROVIDER_SITE_OTHER): Payer: 59

## 2018-12-03 ENCOUNTER — Encounter: Payer: Self-pay | Admitting: Podiatry

## 2018-12-03 DIAGNOSIS — M722 Plantar fascial fibromatosis: Secondary | ICD-10-CM

## 2018-12-03 MED ORDER — MELOXICAM 15 MG PO TABS: 15.0000 mg | ORAL_TABLET | Freq: Every day | ORAL | 0 refills | Status: DC

## 2018-12-03 NOTE — Patient Instructions (Signed)
If was nice to meet you today. If you have any questions or any further concerns, please feel fee to give me a call. You can call our office at [phone] or please feel fee to send me a message through MyChart.   ----  For instructions on how to put on your Plantar Fascial Brace, please visit [email or web address]  ---     Plantar Fasciitis (Heel Spur Syndrome) with Rehab The plantar fascia is a fibrous, ligament-like, soft-tissue structure that spans the bottom of the foot. Plantar fasciitis is a condition that causes pain in the foot due to inflammation of the tissue. SYMPTOMS  Pain and tenderness on the underneath side of the foot. Pain that worsens with standing or walking. CAUSES  Plantar fasciitis is caused by irritation and injury to the plantar fascia on the underneath side of the foot. Common mechanisms of injury include: Direct trauma to bottom of the foot. Damage to a small nerve that runs under the foot where the main fascia attaches to the heel bone. Stress placed on the plantar fascia due to bone spurs. RISK INCREASES WITH:  Activities that place stress on the plantar fascia (running, jumping, pivoting, or cutting). Poor strength and flexibility. Improperly fitted shoes. Tight calf muscles. Flat feet. Failure to warm-up properly before activity. Obesity. PREVENTION Warm up and stretch properly before activity. Allow for adequate recovery between workouts. Maintain physical fitness: Strength, flexibility, and endurance. Cardiovascular fitness. Maintain a health body weight. Avoid stress on the plantar fascia. Wear properly fitted shoes, including arch supports for individuals who have flat feet.  PROGNOSIS  If treated properly, then the symptoms of plantar fasciitis usually resolve without surgery. However, occasionally surgery is necessary.  RELATED COMPLICATIONS  Recurrent symptoms that may result in a chronic condition. Problems of the lower back  that are caused by compensating for the injury, such as limping. Pain or weakness of the foot during push-off following surgery. Chronic inflammation, scarring, and partial or complete fascia tear, occurring more often from repeated injections.  TREATMENT  Treatment initially involves the use of ice and medication to help reduce pain and inflammation. The use of strengthening and stretching exercises may help reduce pain with activity, especially stretches of the Achilles tendon. These exercises may be performed at home or with a therapist. Your caregiver may recommend that you use heel cups of arch supports to help reduce stress on the plantar fascia. Occasionally, corticosteroid injections are given to reduce inflammation. If symptoms persist for greater than 6 months despite non-surgical (conservative), then surgery may be recommended.   MEDICATION  If pain medication is necessary, then nonsteroidal anti-inflammatory medications, such as aspirin and ibuprofen, or other minor pain relievers, such as acetaminophen, are often recommended. Do not take pain medication within 7 days before surgery. Prescription pain relievers may be given if deemed necessary by your caregiver. Use only as directed and only as much as you need. Corticosteroid injections may be given by your caregiver. These injections should be reserved for the most serious cases, because they may only be given a certain number of times.  HEAT AND COLD Cold treatment (icing) relieves pain and reduces inflammation. Cold treatment should be applied for 10 to 15 minutes every 2 to 3 hours for inflammation and pain and immediately after any activity that aggravates your symptoms. Use ice packs or massage the area with a piece of ice (ice massage). Heat treatment may be used prior to performing the stretching and strengthening activities prescribed by your caregiver,   physical therapist, or athletic trainer. Use a heat pack or soak the injury  in warm water.  SEEK IMMEDIATE MEDICAL CARE IF: Treatment seems to offer no benefit, or the condition worsens. Any medications produce adverse side effects.  EXERCISES- RANGE OF MOTION (ROM) AND STRETCHING EXERCISES - Plantar Fasciitis (Heel Spur Syndrome) These exercises may help you when beginning to rehabilitate your injury. Your symptoms may resolve with or without further involvement from your physician, physical therapist or athletic trainer. While completing these exercises, remember:  Restoring tissue flexibility helps normal motion to return to the joints. This allows healthier, less painful movement and activity. An effective stretch should be held for at least 30 seconds. A stretch should never be painful. You should only feel a gentle lengthening or release in the stretched tissue.  RANGE OF MOTION - Toe Extension, Flexion Sit with your right / left leg crossed over your opposite knee. Grasp your toes and gently pull them back toward the top of your foot. You should feel a stretch on the bottom of your toes and/or foot. Hold this stretch for 10 seconds. Now, gently pull your toes toward the bottom of your foot. You should feel a stretch on the top of your toes and or foot. Hold this stretch for 10 seconds. Repeat  times. Complete this stretch 3 times per day.   RANGE OF MOTION - Ankle Dorsiflexion, Active Assisted Remove shoes and sit on a chair that is preferably not on a carpeted surface. Place right / left foot under knee. Extend your opposite leg for support. Keeping your heel down, slide your right / left foot back toward the chair until you feel a stretch at your ankle or calf. If you do not feel a stretch, slide your bottom forward to the edge of the chair, while still keeping your heel down. Hold this stretch for 10 seconds. Repeat 3 times. Complete this stretch 2 times per day.   STRETCH  Gastroc, Standing Place hands on wall. Extend right / left leg, keeping the  front knee somewhat bent. Slightly point your toes inward on your back foot. Keeping your right / left heel on the floor and your knee straight, shift your weight toward the wall, not allowing your back to arch. You should feel a gentle stretch in the right / left calf. Hold this position for 10 seconds. Repeat 3 times. Complete this stretch 2 times per day.  STRETCH  Soleus, Standing Place hands on wall. Extend right / left leg, keeping the other knee somewhat bent. Slightly point your toes inward on your back foot. Keep your right / left heel on the floor, bend your back knee, and slightly shift your weight over the back leg so that you feel a gentle stretch deep in your back calf. Hold this position for 10 seconds. Repeat 3 times. Complete this stretch 2 times per day.  STRETCH  Gastrocsoleus, Standing  Note: This exercise can place a lot of stress on your foot and ankle. Please complete this exercise only if specifically instructed by your caregiver.  Place the ball of your right / left foot on a step, keeping your other foot firmly on the same step. Hold on to the wall or a rail for balance. Slowly lift your other foot, allowing your body weight to press your heel down over the edge of the step. You should feel a stretch in your right / left calf. Hold this position for 10 seconds. Repeat this exercise with a   slight bend in your right / left knee. Repeat 3 times. Complete this stretch 2 times per day.   STRENGTHENING EXERCISES - Plantar Fasciitis (Heel Spur Syndrome)  These exercises may help you when beginning to rehabilitate your injury. They may resolve your symptoms with or without further involvement from your physician, physical therapist or athletic trainer. While completing these exercises, remember:  Muscles can gain both the endurance and the strength needed for everyday activities through controlled exercises. Complete these exercises as instructed by your physician,  physical therapist or athletic trainer. Progress the resistance and repetitions only as guided.  STRENGTH - Towel Curls Sit in a chair positioned on a non-carpeted surface. Place your foot on a towel, keeping your heel on the floor. Pull the towel toward your heel by only curling your toes. Keep your heel on the floor. Repeat 3 times. Complete this exercise 2 times per day.  STRENGTH - Ankle Inversion Secure one end of a rubber exercise band/tubing to a fixed object (table, pole). Loop the other end around your foot just before your toes. Place your fists between your knees. This will focus your strengthening at your ankle. Slowly, pull your big toe up and in, making sure the band/tubing is positioned to resist the entire motion. Hold this position for 10 seconds. Have your muscles resist the band/tubing as it slowly pulls your foot back to the starting position. Repeat 3 times. Complete this exercises 2 times per day.  Document Released: 11/11/2005 Document Revised: 02/03/2012 Document Reviewed: 02/23/2009 [employer] Patient Information 2014 [employer]  

## 2018-12-03 NOTE — Progress Notes (Signed)
   Subjective:    Patient ID: Kari Rodriguez, female    DOB: Apr 15, 1991, 28 y.o.   MRN: [id number]  HPI 28 year old female presents the office today for concerns of pain in the left heel which hurts on the bottom.  She says it is sore and tender at times and gets worse after she did not on her feet all day at work.  She is taken ibuprofen.  Today her pain level is 2/10.  She denies any recent injury or trauma this is been ongoing the last week.  She states that she is starting some pain to the back of her heel as well.  The pain does not wake her up at night.  She has no other concerns.   Review of Systems  All other systems reviewed and are negative.  History reviewed. No pertinent past medical history.  Past Surgical History:  Procedure Laterality Date  . CESAREAN SECTION    . sweat gland removed Right 12/2016     Current Outpatient Medications:  .  meloxicam (MOBIC) 15 MG tablet, Take 1 tablet (15 mg total) by mouth daily., Disp: 30 tablet, Rfl: 0  No Known Allergies      Objective:   Physical Exam General: AAO x3, NAD  Dermatological: Skin is warm, dry and supple bilateral. Nails x 10 are well manicured; remaining integument appears unremarkable at this time. There are no open sores, no preulcerative lesions, no rash or signs of infection present.  Vascular: Dorsalis Pedis artery and Posterior Tibial artery pedal pulses are 2/4 bilateral with immedate capillary fill time. Pedal hair growth present. No varicosities and no lower extremity edema present bilateral. There is no pain with calf compression, swelling, warmth, erythema.   Neruologic: Grossly intact via light touch bilateral.Protective threshold with Semmes Wienstein monofilament intact to all pedal sites bilateral.  Negative Tinel sign.  Musculoskeletal: There is tenderness palpation on the plantar medial tubercle of the calcaneus and insertion of plantar fashion on the left heel.  She started get some discomfort  in the posterior aspect the heel as well.  Achilles tendon, plantar fascia appear to be intact.  There is no edema, erythema.  No pain with lateral compression of the calcaneus no other areas of tenderness.  Muscular strength 5/5 in all groups tested bilateral.  Gait: Unassisted, Nonantalgic.      Assessment & Plan:  28 year old female with left heel pain, plantar fasciitis -Treatment options discussed including all alternatives, risks, and complications -Etiology of symptoms were discussed -X-rays were obtained and reviewed with the patient.  There is no evidence of acute fracture or stress fracture identified today. -Prescribed mobic. Discussed side effects of the medication and directed to stop if any are to occur and call the office.  -Discussed steroid injection. -Plantar fascial brace dispensed. -Discussed shoe gear modifications and orthotics. -Stretching, icing daily.  Return in about 4 weeks (around 12/31/2018).  Vivi Barrack DPM

## 2019-04-27 DIAGNOSIS — Z0000 Encounter for general adult medical examination without abnormal findings: Secondary | ICD-10-CM | POA: Diagnosis not present

## 2019-04-27 DIAGNOSIS — Z13220 Encounter for screening for lipoid disorders: Secondary | ICD-10-CM | POA: Diagnosis not present

## 2019-08-05 ENCOUNTER — Other Ambulatory Visit: Payer: Self-pay | Admitting: Nurse Practitioner

## 2019-08-05 ENCOUNTER — Other Ambulatory Visit (HOSPITAL_COMMUNITY)
Admission: RE | Admit: 2019-08-05 | Discharge: 2019-08-05 | Disposition: A | Discharge status: Home / Self Care | Payer: Self-pay | Source: Ambulatory Visit | Attending: Nurse Practitioner | Admitting: Nurse Practitioner

## 2019-08-05 DIAGNOSIS — Z124 Encounter for screening for malignant neoplasm of cervix: Secondary | ICD-10-CM | POA: Insufficient documentation

## 2019-08-05 DIAGNOSIS — Z01419 Encounter for gynecological examination (general) (routine) without abnormal findings: Secondary | ICD-10-CM | POA: Diagnosis not present

## 2019-08-07 LAB — CYTOLOGY - PAP
Diagnosis: NEGATIVE
HPV: NOT DETECTED

## 2019-09-20 ENCOUNTER — Encounter: Payer: Self-pay | Admitting: *Deleted

## 2019-09-20 ENCOUNTER — Other Ambulatory Visit: Payer: Self-pay

## 2019-09-20 ENCOUNTER — Ambulatory Visit (INDEPENDENT_AMBULATORY_CARE_PROVIDER_SITE_OTHER): Payer: BC Managed Care – PPO | Admitting: Neurology

## 2019-09-20 ENCOUNTER — Encounter: Payer: Self-pay | Admitting: Neurology

## 2019-09-20 VITALS — BP 127/91 | HR 88 | Temp 97.7°F | Ht 70.0 in | Wt 310.0 lb

## 2019-09-20 DIAGNOSIS — G441 Vascular headache, not elsewhere classified: Secondary | ICD-10-CM | POA: Diagnosis not present

## 2019-09-20 DIAGNOSIS — R51 Headache with orthostatic component, not elsewhere classified: Secondary | ICD-10-CM | POA: Diagnosis not present

## 2019-09-20 DIAGNOSIS — G4484 Primary exertional headache: Secondary | ICD-10-CM

## 2019-09-20 DIAGNOSIS — H5711 Ocular pain, right eye: Secondary | ICD-10-CM

## 2019-09-20 DIAGNOSIS — G43009 Migraine without aura, not intractable, without status migrainosus: Secondary | ICD-10-CM | POA: Diagnosis not present

## 2019-09-20 MED ORDER — RIZATRIPTAN BENZOATE 10 MG PO TBDP: 10.0000 mg | ORAL_TABLET | ORAL | 11 refills | Status: DC | PRN

## 2019-09-20 NOTE — Patient Instructions (Signed)
Maxalt(Rizatriptan): Please take one tablet at the onset of your headache. If it does not improve the symptoms please take one additional tablet. Do not take more then 2 tablets in 24hrs. Do not take use more then 2 to 3 times in a week.  Rizatriptan tablets What is this medicine? RIZATRIPTAN (rye za TRIP tan) is used to treat migraines with or without aura. An aura is a strange feeling or visual disturbance that warns you of an attack. It is not used to prevent migraines. This medicine may be used for other purposes; ask your health care provider or pharmacist if you have questions. COMMON BRAND NAME(S): Maxalt What should I tell my health care provider before I take this medicine? They need to know if you have any of these conditions:  cigarette smoker  circulation problems in fingers and toes  diabetes  heart disease  high blood pressure  high cholesterol  history of irregular heartbeat  history of stroke  kidney disease  liver disease  stomach or intestine problems  an unusual or allergic reaction to rizatriptan, other medicines, foods, dyes, or preservatives  pregnant or trying to get pregnant  breast-feeding How should I use this medicine? Take this medicine by mouth with a glass of water. Follow the directions on the prescription label. Do not take it more often than directed. Talk to your pediatrician regarding the use of this medicine in children. While this drug may be prescribed for children as young as 6 years for selected conditions, precautions do apply. Overdosage: If you think you have taken too much of this medicine contact a poison control center or emergency room at once. NOTE: This medicine is only for you. Do not share this medicine with others. What if I miss a dose? This does not apply. This medicine is not for regular use. What may interact with this medicine? Do not take this medicine with any of the following medicines:  certain medicines for  migraine headache like almotriptan, eletriptan, frovatriptan, naratriptan, rizatriptan, sumatriptan, zolmitriptan  ergot alkaloids like dihydroergotamine, ergonovine, ergotamine, methylergonovine  MAOIs like Carbex, Eldepryl, Marplan, Nardil, and Parnate This medicine may also interact with the following medications:  certain medicines for depression, anxiety, or psychotic disorders  propranolol This list may not describe all possible interactions. Give your health care provider a list of all the medicines, herbs, non-prescription drugs, or dietary supplements you use. Also tell them if you smoke, drink alcohol, or use illegal drugs. Some items may interact with your medicine. What should I watch for while using this medicine? Visit your healthcare professional for regular checks on your progress. Tell your healthcare professional if your symptoms do not start to get better or if they get worse. You may get drowsy or dizzy. Do not drive, use machinery, or do anything that needs mental alertness until you know how this medicine affects you. Do not stand up or sit up quickly, especially if you are an older patient. This reduces the risk of dizzy or fainting spells. Alcohol may interfere with the effect of this medicine. Your mouth may get dry. Chewing sugarless gum or sucking hard candy and drinking plenty of water may help. Contact your healthcare professional if the problem does not go away or is severe. If you take migraine medicines for 10 or more days a month, your migraines may get worse. Keep a diary of headache days and medicine use. Contact your healthcare professional if your migraine attacks occur more frequently. What side effects may   I notice from receiving this medicine? Side effects that you should report to your doctor or health care professional as soon as possible:  allergic reactions like skin rash, itching or hives, swelling of the face, lips, or tongue  chest pain or chest  tightness  signs and symptoms of a dangerous change in heartbeat or heart rhythm like chest pain; dizziness; fast, irregular heartbeat; palpitations; feeling faint or lightheaded; falls; breathing problems  signs and symptoms of a stroke like changes in vision; confusion; trouble speaking or understanding; severe headaches; sudden numbness or weakness of the face, arm or leg; trouble walking; dizziness; loss of balance or coordination  signs and symptoms of serotonin syndrome like irritable; confusion; diarrhea; fast or irregular heartbeat; muscle twitching; stiff muscles; trouble walking; sweating; high fever; seizures; chills; vomiting Side effects that usually do not require medical attention (report to your doctor or health care professional if they continue or are bothersome):  diarrhea  dizziness  drowsiness  dry mouth  headache  nausea, vomiting  pain, tingling, numbness in the hands or feet  stomach pain This list may not describe all possible side effects. Call your doctor for medical advice about side effects. You may report side effects to FDA at 1-800-FDA-1088. Where should I keep my medicine? Keep out of the reach of children. Store at room temperature between 15 and 30 degrees C (59 and 86 degrees F). Keep container tightly closed. Throw away any unused medicine after the expiration date. NOTE: This sheet is a summary. It may not cover all possible information. If you have questions about this medicine, talk to your doctor, pharmacist, or health care provider.  2020 Elsevier/Gold Standard (2018-05-26 14:59:59)   
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
## 2019-09-21 LAB — COMPREHENSIVE METABOLIC PANEL
ALT: 25 IU/L (ref 0–32)
AST: 21 IU/L (ref 0–40)
Albumin/Globulin Ratio: 1.2 (ref 1.2–2.2)
Albumin: 3.9 g/dL (ref 3.9–5.0)
Alkaline Phosphatase: 54 IU/L (ref 39–117)
BUN/Creatinine Ratio: 11 (ref 9–23)
BUN: 10 mg/dL (ref 6–20)
Bilirubin Total: 0.2 mg/dL (ref 0.0–1.2)
CO2: 23 mmol/L (ref 20–29)
Calcium: 9.4 mg/dL (ref 8.7–10.2)
Chloride: 104 mmol/L (ref 96–106)
Creatinine, Ser: 0.94 mg/dL (ref 0.57–1.00)
GFR calc Af Amer: 94 mL/min/{1.73_m2} (ref >59)
GFR calc non Af Amer: 82 mL/min/{1.73_m2} (ref >59)
Globulin, Total: 3.3 g/dL (ref 1.5–4.5)
Glucose: 89 mg/dL (ref 65–99)
Potassium: 4.9 mmol/L (ref 3.5–5.2)
Sodium: 138 mmol/L (ref 134–144)
Total Protein: 7.2 g/dL (ref 6.0–8.5)

## 2019-09-21 LAB — CBC WITH DIFFERENTIAL/PLATELET
Basophils Absolute: 0.1 10*3/uL (ref 0.0–0.2)
Basos: 1 %
EOS (ABSOLUTE): 0.2 10*3/uL (ref 0.0–0.4)
Eos: 3 %
Hematocrit: 37 % (ref 34.0–46.6)
Hemoglobin: 12.3 g/dL (ref 11.1–15.9)
Immature Grans (Abs): 0 10*3/uL (ref 0.0–0.1)
Immature Granulocytes: 0 %
Lymphocytes Absolute: 2.7 10*3/uL (ref 0.7–3.1)
Lymphs: 39 %
MCH: 27.7 pg (ref 26.6–33.0)
MCHC: 33.2 g/dL (ref 31.5–35.7)
MCV: 83 fL (ref 79–97)
Monocytes Absolute: 0.6 10*3/uL (ref 0.1–0.9)
Monocytes: 9 %
Neutrophils Absolute: 3.3 10*3/uL (ref 1.4–7.0)
Neutrophils: 48 %
Platelets: 446 10*3/uL (ref 150–450)
RBC: 4.44 x10E6/uL (ref 3.77–5.28)
RDW: 13.2 % (ref 11.7–15.4)
WBC: 6.9 10*3/uL (ref 3.4–10.8)

## 2019-09-21 LAB — TSH: TSH: 0.957 u[IU]/mL (ref 0.450–4.500)

## 2019-09-22 ENCOUNTER — Telehealth: Payer: Self-pay | Admitting: Neurology

## 2019-09-22 NOTE — Telephone Encounter (Signed)
Patient scheduled for 12/07/18

## 2019-09-22 NOTE — Telephone Encounter (Signed)
[employer] Auth: [id number] (exp. 09/22/19 to 03/19/20) order faxed to [facility] they will reach out to the patient to schedule.

## 2019-09-28 DIAGNOSIS — H5711 Ocular pain, right eye: Secondary | ICD-10-CM | POA: Diagnosis not present

## 2019-10-05 ENCOUNTER — Telehealth: Payer: Self-pay | Admitting: *Deleted

## 2019-10-05 NOTE — Telephone Encounter (Signed)
Patient messaged asking for MRI brain results. We have received a report from [facility].   Impression: Partially empty sella. This can be due to increased intracranial pressure. Otherwise normal.

## 2019-10-05 NOTE — Telephone Encounter (Signed)
MRI of the brain was normal except we saw something that may indicate there is too much fluid in the brain something called Idiopathic Intracranial Hypertension. We should probably discuss. We may need a lumbar puncture to examine the fluid pressure and ophthalmology appointment. Can we get her in with me or Amy. To discuss please? thanks

## 2019-10-05 NOTE — Telephone Encounter (Signed)
Spoke with patient and discussed MRI brain results. She verbalized understanding. She will come in tomorrow morning for a 10:30 appt arrival 15-30 minutes prior to discuss next steps for IIH work-up. Her questions were answered. Patient verbalized appreciation for the call.

## 2019-10-06 ENCOUNTER — Other Ambulatory Visit: Payer: Self-pay

## 2019-10-06 ENCOUNTER — Ambulatory Visit (INDEPENDENT_AMBULATORY_CARE_PROVIDER_SITE_OTHER): Payer: BC Managed Care – PPO | Admitting: Neurology

## 2019-10-06 ENCOUNTER — Encounter: Payer: Self-pay | Admitting: Neurology

## 2019-10-06 VITALS — BP 113/73 | HR 77 | Temp 97.8°F | Ht 71.0 in | Wt 311.0 lb

## 2019-10-06 DIAGNOSIS — G43009 Migraine without aura, not intractable, without status migrainosus: Secondary | ICD-10-CM

## 2019-10-06 DIAGNOSIS — G932 Benign intracranial hypertension: Secondary | ICD-10-CM | POA: Diagnosis not present

## 2019-10-06 NOTE — Patient Instructions (Signed)
Idiopathic Intracranial Hypertension  Idiopathic intracranial hypertension (IIH) is a condition that increases pressure around the brain. The fluid that surrounds the brain and spinal cord (cerebrospinal fluid, CSF) increases and causes the pressure. Idiopathic means that the cause of this condition is not known. IIH affects the brain and spinal cord (is a neurological disorder). If this condition is not treated, it can cause vision loss or blindness. What increases the risk? You are more likely to develop this condition if:  You are severely overweight (obese).  You are a woman who has not gone through menopause.  You take certain medicines, such as birth control or steroids. What are the signs or symptoms? Symptoms of IIH include:  Headaches. This is the most common symptom.  Pain in the shoulders or neck.  Nausea and vomiting.  A "rushing water" or pulsing sound within the ears (pulsatile tinnitus).  Double vision.  Blurred vision.  Brief episodes of complete vision loss. How is this diagnosed? This condition may be diagnosed based on:  Your symptoms.  Your medical history.  CT scan of the brain.  MRI of the brain.  Magnetic resonance venogram (MRV) to check veins in the brain.  Diagnostic lumbar puncture. This is a procedure to remove and examine a sample of cerebrospinal fluid. This procedure can determine whether too much fluid may be causing IIH.  A thorough eye exam to check for swelling or nerve damage in the eyes. How is this treated? Treatment for this condition depends on your symptoms. The goal of treatment is to decrease the pressure around your brain. Common treatments include:  Medicines to decrease the production of spinal fluid and lower the pressure within your skull.  Medicines to prevent or treat headaches.  Surgery to place drains (shunts) in your brain to remove excess fluid.  Lumbar puncture to remove excess cerebrospinal fluid. Follow  these instructions at home:  If you are overweight or obese, work with your health care provider to lose weight.  Take over-the-counter and prescription medicines only as told by your health care provider.  Do not drive or use heavy machinery while taking medicines that can make you sleepy.  Keep all follow-up visits as told by your health care provider. This is important. Contact a health care provider if:  You have changes in your vision, such as: ? Double vision. ? Not being able to see colors (color vision). Get help right away if:  You have any of the following symptoms and they get worse or do not get better. ? Headaches. ? Nausea. ? Vomiting. ? Vision changes or difficulty seeing. Summary  Idiopathic intracranial hypertension (IIH) is a condition that increases pressure around the brain. The cause is not known (is idiopathic).  The most common symptom of IIH is headaches.  Treatment may include medicines or surgery to relieve the pressure on your brain. This information is not intended to replace advice given to you by your health care provider. Make sure you discuss any questions you have with your health care provider. Document Released: 01/20/2002 Document Revised: 10/24/2017 Document Reviewed: 10/02/2016 Elsevier Patient Education  2020 Elsevier Inc.  
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
## 2019-10-07 ENCOUNTER — Encounter: Payer: Self-pay | Admitting: Neurology

## 2019-10-25 DIAGNOSIS — G932 Benign intracranial hypertension: Secondary | ICD-10-CM | POA: Diagnosis not present

## 2019-12-03 DIAGNOSIS — R05 Cough: Secondary | ICD-10-CM | POA: Diagnosis not present

## 2019-12-09 ENCOUNTER — Other Ambulatory Visit: Payer: Self-pay | Admitting: Physician Assistant

## 2019-12-09 ENCOUNTER — Ambulatory Visit
Admission: RE | Admit: 2019-12-09 | Discharge: 2019-12-09 | Disposition: A | Discharge status: Home / Self Care | Payer: BC Managed Care – PPO | Source: Ambulatory Visit | Attending: Physician Assistant | Admitting: Physician Assistant

## 2019-12-09 ENCOUNTER — Other Ambulatory Visit: Payer: Self-pay

## 2019-12-09 DIAGNOSIS — R05 Cough: Secondary | ICD-10-CM

## 2019-12-09 DIAGNOSIS — R059 Cough, unspecified: Secondary | ICD-10-CM

## 2020-01-04 ENCOUNTER — Encounter: Payer: Self-pay | Admitting: Emergency Medicine

## 2020-01-04 ENCOUNTER — Other Ambulatory Visit: Payer: Self-pay

## 2020-01-04 ENCOUNTER — Ambulatory Visit (INDEPENDENT_AMBULATORY_CARE_PROVIDER_SITE_OTHER): Payer: BC Managed Care – PPO | Admitting: Emergency Medicine

## 2020-01-04 VITALS — BP 112/92 | HR 103 | Ht 71.0 in | Wt 321.0 lb

## 2020-01-04 DIAGNOSIS — R059 Cough, unspecified: Secondary | ICD-10-CM

## 2020-01-04 DIAGNOSIS — R05 Cough: Secondary | ICD-10-CM | POA: Diagnosis not present

## 2020-01-04 DIAGNOSIS — J9811 Atelectasis: Secondary | ICD-10-CM | POA: Diagnosis not present

## 2020-01-04 MED ORDER — FLUTICASONE PROPIONATE 50 MCG/ACT NA SUSP: 2.0000 | Freq: Every day | NASAL | 5 refills | Status: DC

## 2020-01-04 NOTE — Assessment & Plan Note (Signed)
Subtle finding of a small linear opacity left midlung.  Probably is mild atelectasis, consider also vasculature.  Doubt that this is contributing to her cough, likely clinically insignificant.  She has a history of remote pneumonia and this could be a contributor, possible chronic scar.  She also may hypoventilate, may merit an evaluation for OSA/OHS in the future.  Given the linear appearance doubt any proximal obstruction, etc.
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
## 2020-01-04 NOTE — Patient Instructions (Signed)
Please start loratadine 10 mg once daily until next visit Please start fluticasone nasal spray, 2 sprays each nostril each evening.  Do not take it right before bedtime. Work on addressing the suspected water damage in your bathroom.  This might be causing an exposure that is contributing to your symptoms. We will perform pulmonary function testing Follow with Dr. Delton Coombes next available with full pulmonary function testing on the same day.

## 2020-01-04 NOTE — Assessment & Plan Note (Signed)
Based on her description, the sensation of drainage down the back of her throat through the day, accumulated mucus overnight that she has to clear every morning I suspect that the largest influence here is chronic allergic rhinitis.  There may be a seasonal component as she notes that she has been able to go for months symptom-free but the cough and mucus ultimately comes back.  She does not have any rhinorrhea, everything seems to go down the back of her throat.  We will try to treat chronic rhinitis empirically.  She denies any GERD so hold off on therapy for now.  Based on her description and in the absence of other symptoms I doubt asthma but we will check PFT.  Her chest x-ray was reassuring with the exception of some mild left midlung atelectasis.  Please start loratadine 10 mg once daily until next visit Please start fluticasone nasal spray, 2 sprays each nostril each evening.  Do not take it right before bedtime. Work on addressing the suspected water damage in your bathroom.  This might be causing an exposure that is contributing to your symptoms. We will perform pulmonary function testing Follow with Dr. Delton Coombes next available with full pulmonary function testing on the same day.

## 2020-03-03 ENCOUNTER — Other Ambulatory Visit (HOSPITAL_COMMUNITY)
Admission: RE | Admit: 2020-03-03 | Discharge: 2020-03-03 | Disposition: A | Discharge status: Home / Self Care | Payer: BC Managed Care – PPO | Source: Ambulatory Visit | Attending: Emergency Medicine | Admitting: Emergency Medicine

## 2020-03-03 DIAGNOSIS — Z20822 Contact with and (suspected) exposure to covid-19: Secondary | ICD-10-CM | POA: Diagnosis not present

## 2020-03-03 DIAGNOSIS — Z01812 Encounter for preprocedural laboratory examination: Secondary | ICD-10-CM | POA: Diagnosis not present

## 2020-03-03 LAB — SARS CORONAVIRUS 2 (TAT 6-24 HRS): SARS Coronavirus 2: NEGATIVE

## 2020-03-06 ENCOUNTER — Encounter: Payer: Self-pay | Admitting: Adult Health

## 2020-03-06 ENCOUNTER — Ambulatory Visit (INDEPENDENT_AMBULATORY_CARE_PROVIDER_SITE_OTHER): Payer: BC Managed Care – PPO | Admitting: Adult Health

## 2020-03-06 ENCOUNTER — Ambulatory Visit (INDEPENDENT_AMBULATORY_CARE_PROVIDER_SITE_OTHER): Payer: BC Managed Care – PPO | Admitting: Emergency Medicine

## 2020-03-06 ENCOUNTER — Other Ambulatory Visit: Payer: Self-pay

## 2020-03-06 DIAGNOSIS — R059 Cough, unspecified: Secondary | ICD-10-CM

## 2020-03-06 DIAGNOSIS — R05 Cough: Secondary | ICD-10-CM | POA: Diagnosis not present

## 2020-03-06 LAB — PULMONARY FUNCTION TEST
DL/VA % pred: 114 %
DL/VA: 4.99 ml/min/mmHg/L
DLCO unc % pred: 97 %
DLCO unc: 27.25 ml/min/mmHg
FEF 25-75 Post: 2.91 L/sec
FEF 25-75 Pre: 2.66 L/sec
FEF2575-%Change-Post: 9 %
FEF2575-%Pred-Post: 78 %
FEF2575-%Pred-Pre: 72 %
FEV1-%Change-Post: 2 %
FEV1-%Pred-Post: 93 %
FEV1-%Pred-Pre: 91 %
FEV1-Post: 3.17 L
FEV1-Pre: 3.1 L
FEV1FVC-%Change-Post: 4 %
FEV1FVC-%Pred-Pre: 89 %
FEV6-%Change-Post: -1 %
FEV6-%Pred-Post: 99 %
FEV6-%Pred-Pre: 101 %
FEV6-Post: 3.96 L
FEV6-Pre: 4.04 L
FEV6FVC-%Change-Post: 0 %
FEV6FVC-%Pred-Post: 101 %
FEV6FVC-%Pred-Pre: 100 %
FVC-%Change-Post: -2 %
FVC-%Pred-Post: 98 %
FVC-%Pred-Pre: 100 %
FVC-Post: 3.96 L
FVC-Pre: 4.06 L
Post FEV1/FVC ratio: 80 %
Post FEV6/FVC ratio: 100 %
Pre FEV1/FVC ratio: 76 %
Pre FEV6/FVC Ratio: 99 %
RV % pred: 96 %
RV: 1.69 L
TLC % pred: 93 %
TLC: 5.7 L

## 2020-03-06 MED ORDER — BENZONATATE 200 MG PO CAPS: 200.0000 mg | ORAL_CAPSULE | Freq: Three times a day (TID) | ORAL | 1 refills | Status: DC | PRN

## 2020-03-06 NOTE — Progress Notes (Signed)
PFT completed today.  

## 2020-03-06 NOTE — Patient Instructions (Addendum)
Prednisone taper over next week. -take with food  Delsym 2 tsp Twice daily  As needed  For cough .  Tessalon Three times a day  For cough .  Sips of water to soothe throat , no Mints. No throat clearing .  Continue on Claritin daily  Continue on Flonase daily .  Begin Pepcid Twice daily  (1 in am and 1 At bedtime  ) .  Follow up in 3-4 weeks with Dr. Delton Coombes  and As needed   Please contact office for sooner follow up if symptoms do not improve or worsen or seek emergency care

## 2020-03-06 NOTE — Assessment & Plan Note (Signed)
Chronic cough suspect is multifactorial with underlying postnasal drainage and possible GERD.  Pulmonary function testing shows normal lung function.  Chest x-ray was clear except for some midlung atelectasis. We will treat with cough suppression regimen along with trigger prevention  Plan  Patient Instructions  Prednisone taper over next week. -take with food  Delsym 2 tsp Twice daily  As needed  For cough .  Tessalon Three times a day  For cough .  Sips of water to soothe throat , no Mints. No throat clearing .  Continue on Claritin daily  Continue on Flonase daily .  Begin Pepcid Twice daily  (1 in am and 1 At bedtime  ) .  Follow up in 3-4 weeks with Dr. Delton Coombes  and As needed   Please contact office for sooner follow up if symptoms do not improve or worsen or seek emergency care
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
## 2020-03-07 ENCOUNTER — Telehealth: Payer: Self-pay | Admitting: Adult Health

## 2020-03-07 MED ORDER — PREDNISONE 10 MG PO TABS: ORAL_TABLET | ORAL | 0 refills | Status: DC

## 2020-03-07 NOTE — Telephone Encounter (Signed)
Called and spoke with pt. Pt states that an rx for prednisone taper was not sent to pharmacy for her after visit with TP 4/12. Tammy, please advise instructions for the pred taper.

## 2020-03-07 NOTE — Telephone Encounter (Signed)
Assessment & Plan Note by Julio Sicks, NP at 03/06/2020 4:32 PM Author: Julio Sicks, NP Author Type: Nurse Practitioner Filed: 03/06/2020 4:32 PM  Note Status: Written Cosign: Cosign Not Required Encounter Date: 03/06/2020  Problem: Cough  Editor: Julio Sicks, NP (Nurse Practitioner)    Chronic cough suspect is multifactorial with underlying postnasal drainage and possible GERD.  Pulmonary function testing shows normal lung function.  Chest x-ray was clear except for some midlung atelectasis. We will treat with cough suppression regimen along with trigger prevention  Plan  Patient Instructions  Prednisone taper over next week. -take with food  Delsym 2 tsp Twice daily  As needed  For cough .  Tessalon Three times a day  For cough .  Sips of water to soothe throat , no Mints. No throat clearing .  Continue on Claritin daily  Continue on Flonase daily .  Begin Pepcid Twice daily  (1 in am and 1 At bedtime  ) .  Follow up in 3-4 weeks with Dr. Delton Coombes  and As needed   Please contact office for sooner follow up if symptoms do not improve or worsen or seek emergency care      Attempted to call pt but unable to reach. Left message for pt to return call.

## 2020-03-07 NOTE — Telephone Encounter (Signed)
Sorry about that  Prednisone taper sent

## 2020-03-07 NOTE — Telephone Encounter (Signed)
Called and spoke with pt letting her know that the prednisone rx was sent to pharmacy for her. Pt verbalized understanding. Nothing further needed.

## 2020-03-07 NOTE — Telephone Encounter (Signed)
Pt returning call. [phone]

## 2020-03-20 ENCOUNTER — Encounter: Payer: Self-pay | Admitting: Neurology

## 2020-03-20 ENCOUNTER — Other Ambulatory Visit: Payer: Self-pay

## 2020-03-20 ENCOUNTER — Ambulatory Visit (INDEPENDENT_AMBULATORY_CARE_PROVIDER_SITE_OTHER): Payer: BC Managed Care – PPO | Admitting: Neurology

## 2020-03-20 VITALS — BP 138/89 | HR 90 | Temp 97.4°F | Ht 71.0 in | Wt 322.0 lb

## 2020-03-20 DIAGNOSIS — G43009 Migraine without aura, not intractable, without status migrainosus: Secondary | ICD-10-CM | POA: Diagnosis not present

## 2020-03-20 MED ORDER — RIZATRIPTAN BENZOATE 10 MG PO TBDP: 10.0000 mg | ORAL_TABLET | ORAL | 11 refills | Status: DC | PRN
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
## 2020-03-20 NOTE — Patient Instructions (Signed)
Rizatriptan disintegrating tablets What is this medicine? RIZATRIPTAN (rye za TRIP tan) is used to treat migraines with or without aura. An aura is a strange feeling or visual disturbance that warns you of an attack. It is not used to prevent migraines. This medicine may be used for other purposes; ask your health care provider or pharmacist if you have questions. COMMON BRAND NAME(S): Maxalt-MLT What should I tell my health care provider before I take this medicine? They need to know if you have any of these conditions:  cigarette smoker  circulation problems in fingers and toes  diabetes  heart disease  high blood pressure  high cholesterol  history of irregular heartbeat  history of stroke  kidney disease  liver disease  stomach or intestine problems  an unusual or allergic reaction to rizatriptan, other medicines, foods, dyes, or preservatives  pregnant or trying to get pregnant  breast-feeding How should I use this medicine? Take this medicine by mouth. Follow the directions on the prescription label. Leave the tablet in the sealed blister pack until you are ready to take it. With dry hands, open the blister and gently remove the tablet. If the tablet breaks or crumbles, throw it away and take a new tablet out of the blister pack. Place the tablet in the mouth and allow it to dissolve, and then swallow. Do not cut, crush, or chew this medicine. You do not need water to take this medicine. Do not take it more often than directed. Talk to your pediatrician regarding the use of this medicine in children. While this drug may be prescribed for children as young as 6 years for selected conditions, precautions do apply. Overdosage: If you think you have taken too much of this medicine contact a poison control center or emergency room at once. NOTE: This medicine is only for you. Do not share this medicine with others. What if I miss a dose? This does not apply. This medicine is  not for regular use. What may interact with this medicine? Do not take this medicine with any of the following medicines:  certain medicines for migraine headache like almotriptan, eletriptan, frovatriptan, naratriptan, rizatriptan, sumatriptan, zolmitriptan  ergot alkaloids like dihydroergotamine, ergonovine, ergotamine, methylergonovine  MAOIs like Carbex, Eldepryl, Marplan, Nardil, and Parnate This medicine may also interact with the following medications:  certain medicines for depression, anxiety, or psychotic disorders  propranolol This list may not describe all possible interactions. Give your health care provider a list of all the medicines, herbs, non-prescription drugs, or dietary supplements you use. Also tell them if you smoke, drink alcohol, or use illegal drugs. Some items may interact with your medicine. What should I watch for while using this medicine? Visit your healthcare professional for regular checks on your progress. Tell your healthcare professional if your symptoms do not start to get better or if they get worse. You may get drowsy or dizzy. Do not drive, use machinery, or do anything that needs mental alertness until you know how this medicine affects you. Do not stand up or sit up quickly, especially if you are an older patient. This reduces the risk of dizzy or fainting spells. Alcohol may interfere with the effect of this medicine. Your mouth may get dry. Chewing sugarless gum or sucking hard candy and drinking plenty of water may help. Contact your healthcare professional if the problem does not go away or is severe. If you take migraine medicines for 10 or more days a month, your migraines may   get worse. Keep a diary of headache days and medicine use. Contact your healthcare professional if your migraine attacks occur more frequently. What side effects may I notice from receiving this medicine? Side effects that you should report to your doctor or health care  professional as soon as possible:  allergic reactions like skin rash, itching or hives, swelling of the face, lips, or tongue  chest pain or chest tightness  signs and symptoms of a dangerous change in heartbeat or heart rhythm like chest pain; dizziness; fast, irregular heartbeat; palpitations; feeling faint or lightheaded; falls; breathing problems  signs and symptoms of a stroke like changes in vision; confusion; trouble speaking or understanding; severe headaches; sudden numbness or weakness of the face, arm or leg; trouble walking; dizziness; loss of balance or coordination  signs and symptoms of serotonin syndrome like irritable; confusion; diarrhea; fast or irregular heartbeat; muscle twitching; stiff muscles; trouble walking; sweating; high fever; seizures; chills; vomiting Side effects that usually do not require medical attention (report to your doctor or health care professional if they continue or are bothersome):  diarrhea  dizziness  drowsiness  dry mouth  headache  nausea, vomiting  pain, tingling, numbness in the hands or feet  stomach pain This list may not describe all possible side effects. Call your doctor for medical advice about side effects. You may report side effects to FDA at 1-800-FDA-1088. Where should I keep my medicine? Keep out of the reach of children. Store at room temperature between 15 and 30 degrees C (59 and 86 degrees F). Protect from light and moisture. Throw away any unused medicine after the expiration date. NOTE: This sheet is a summary. It may not cover all possible information. If you have questions about this medicine, talk to your doctor, pharmacist, or health care provider.  2020 Elsevier/Gold Standard (2018-05-26 14:58:08)  

## 2020-03-23 DIAGNOSIS — N898 Other specified noninflammatory disorders of vagina: Secondary | ICD-10-CM | POA: Diagnosis not present

## 2020-03-23 DIAGNOSIS — Z202 Contact with and (suspected) exposure to infections with a predominantly sexual mode of transmission: Secondary | ICD-10-CM | POA: Diagnosis not present

## 2020-04-13 ENCOUNTER — Ambulatory Visit (INDEPENDENT_AMBULATORY_CARE_PROVIDER_SITE_OTHER): Payer: BC Managed Care – PPO | Admitting: Adult Health

## 2020-04-13 ENCOUNTER — Other Ambulatory Visit: Payer: Self-pay

## 2020-04-13 ENCOUNTER — Ambulatory Visit: Payer: BC Managed Care – PPO | Admitting: Emergency Medicine

## 2020-04-13 ENCOUNTER — Encounter: Payer: Self-pay | Admitting: Adult Health

## 2020-04-13 DIAGNOSIS — R059 Cough, unspecified: Secondary | ICD-10-CM

## 2020-04-13 DIAGNOSIS — R05 Cough: Secondary | ICD-10-CM | POA: Diagnosis not present

## 2020-04-13 NOTE — Progress Notes (Signed)
Virtual Visit via Telephone Note  I connected with Kari Rodriguez on 04/13/20 at 10:30 AM EDT by telephone and verified that I am speaking with the correct person using two identifiers.  Location: Patient: Home  Provider: Home Office    I discussed the limitations, risks, security and privacy concerns of performing an evaluation and management service by telephone and the availability of in person appointments. I also discussed with the patient that there may be a patient responsible charge related to this service. The patient expressed understanding and agreed to proceed.   History of Present Illness: 29 year old female never smoker seen for pulmonary consult January 04, 2020 for cough that has been present for the last year  Today's televisit is a 1 month follow-up for chronic cough.  Last visit  Patient was seen for follow-up of chronic cough.  She continued to have ongoing minimally productive cough. Chest x-ray showed a linear opacity in the mid left lung.  Pulmonary function testing showed normal lung function.  Patient was treated with a prednisone taper.  Recommend to use Delsym and Tessalon for cough control.  Was continued on Claritin and Flonase.  She was started on Pepcid twice daily.  Since last visit patient says her cough is improved quite a bit.  She also purchased an air purifier for her bedroom which she feels is also helped.  She is using the Delsym and Tessalon for cough control.  She denies any increased cough symptoms.  No fever no discolored mucus no chest pain or shortness of breath.   Patient Active Problem List   Diagnosis Date Noted  . Cough 01/04/2020  . Atelectasis 01/04/2020  . Migraine without aura and without status migrainosus, not intractable 09/20/2019  . Plantar fasciitis 12/03/2018    Current Outpatient Medications on File Prior to Visit  Medication Sig Dispense Refill  . benzonatate (TESSALON) 200 MG capsule Take 1 capsule (200 mg total) by  mouth 3 (three) times daily as needed for cough. 45 capsule 1  . drospirenone-ethinyl estradiol (NIKKI) 3-0.02 MG tablet Take 1 tablet by mouth daily.    . fluticasone (FLONASE) 50 MCG/ACT nasal spray Place 2 sprays into both nostrils daily. (Patient taking differently: Place 2 sprays into both nostrils as needed. ) 16 g 5  . rizatriptan (MAXALT-MLT) 10 MG disintegrating tablet Take 1 tablet (10 mg total) by mouth as needed for migraine. May repeat in 2 hours if needed 9 tablet 11   No current facility-administered medications on file prior to visit.    Observations/Objective: Speaks in full sentences     Assessment and Plan: Upper airway cough syndrome patient is substantially improved with treatment aimed at trigger prevention of allergic rhinitis and GERD.  Also cough suppression regimen. We will continue on current regimen.  If improved after 4 weeks with start to taper off the Pepcid.  And use Delsym and Tessalon as needed  Plan  Patient Instructions  Delsym 2 tsp Twice daily  As needed  For cough .  Tessalon Three times a day  For cough .  Sips of water to soothe throat , no Mints. No throat clearing .  Continue on Claritin daily  Continue on Flonase daily .  Pepcid Twice daily  (1 in am and 1 At bedtime  ) , if improved after 4 weeks can taper off this . Decrease Pepcid 20mg  At bedtime  For 4 weeks and then stop .   Follow up in 3-4 months with Dr.  and As needed   Please contact office for sooner follow up if symptoms do not improve or worsen or seek emergency care       Follow Up Instructions:    I discussed the assessment and treatment plan with the patient. The patient was provided an opportunity to ask questions and all were answered. The patient agreed with the plan and demonstrated an understanding of the instructions.   The patient was advised to call back or seek an in-person evaluation if the symptoms worsen or if the condition fails to improve as  anticipated.  I provided 21 minutes of non-face-to-face time during this encounter.   Rexene Edison, NP

## 2020-04-13 NOTE — Patient Instructions (Signed)
Delsym 2 tsp Twice daily  As needed  For cough .  Tessalon Three times a day  For cough .  Sips of water to soothe throat , no Mints. No throat clearing .  Continue on Claritin daily  Continue on Flonase daily .  Pepcid Twice daily  (1 in am and 1 At bedtime  ) , if improved after 4 weeks can taper off this . Decrease Pepcid 20mg  At bedtime  For 4 weeks and then stop .   Follow up in 3-4 months with Dr.  and As needed   Please contact office for sooner follow up if symptoms do not improve or worsen or seek emergency care

## 2020-06-12 DIAGNOSIS — J019 Acute sinusitis, unspecified: Secondary | ICD-10-CM | POA: Diagnosis not present

## 2020-07-21 DIAGNOSIS — Z0000 Encounter for general adult medical examination without abnormal findings: Secondary | ICD-10-CM | POA: Diagnosis not present

## 2020-07-21 DIAGNOSIS — Z13220 Encounter for screening for lipoid disorders: Secondary | ICD-10-CM | POA: Diagnosis not present

## 2020-08-08 DIAGNOSIS — Z01419 Encounter for gynecological examination (general) (routine) without abnormal findings: Secondary | ICD-10-CM | POA: Diagnosis not present

## 2020-09-03 ENCOUNTER — Other Ambulatory Visit: Payer: Self-pay

## 2020-09-03 ENCOUNTER — Emergency Department (HOSPITAL_COMMUNITY): Payer: BC Managed Care – PPO

## 2020-09-03 ENCOUNTER — Emergency Department (HOSPITAL_COMMUNITY)
Admission: EM | Admit: 2020-09-03 | Discharge: 2020-09-03 | Disposition: A | Discharge status: Home / Self Care | Payer: BC Managed Care – PPO | Attending: Emergency Medicine | Admitting: Emergency Medicine

## 2020-09-03 ENCOUNTER — Encounter (HOSPITAL_COMMUNITY): Payer: Self-pay | Admitting: Emergency Medicine

## 2020-09-03 DIAGNOSIS — M791 Myalgia, unspecified site: Secondary | ICD-10-CM | POA: Diagnosis not present

## 2020-09-03 DIAGNOSIS — Z20822 Contact with and (suspected) exposure to covid-19: Secondary | ICD-10-CM | POA: Insufficient documentation

## 2020-09-03 DIAGNOSIS — R509 Fever, unspecified: Secondary | ICD-10-CM | POA: Diagnosis not present

## 2020-09-03 DIAGNOSIS — R5383 Other fatigue: Secondary | ICD-10-CM | POA: Diagnosis not present

## 2020-09-03 DIAGNOSIS — R079 Chest pain, unspecified: Secondary | ICD-10-CM | POA: Diagnosis not present

## 2020-09-03 DIAGNOSIS — Z79899 Other long term (current) drug therapy: Secondary | ICD-10-CM | POA: Diagnosis not present

## 2020-09-03 DIAGNOSIS — R519 Headache, unspecified: Secondary | ICD-10-CM | POA: Diagnosis not present

## 2020-09-03 DIAGNOSIS — R000 Tachycardia, unspecified: Secondary | ICD-10-CM | POA: Diagnosis not present

## 2020-09-03 LAB — COMPREHENSIVE METABOLIC PANEL
ALT: 21 U/L (ref 0–44)
AST: 23 U/L (ref 15–41)
Albumin: 3.5 g/dL (ref 3.5–5.0)
Alkaline Phosphatase: 48 U/L (ref 38–126)
Anion gap: 11 (ref 5–15)
BUN: 10 mg/dL (ref 6–20)
CO2: 23 mmol/L (ref 22–32)
Calcium: 9.4 mg/dL (ref 8.9–10.3)
Chloride: 104 mmol/L (ref 98–111)
Creatinine, Ser: 1.05 mg/dL — ABNORMAL HIGH (ref 0.44–1.00)
GFR, Estimated: >60 mL/min (ref >60)
Glucose, Bld: 118 mg/dL — ABNORMAL HIGH (ref 70–99)
Potassium: 4.1 mmol/L (ref 3.5–5.1)
Sodium: 138 mmol/L (ref 135–145)
Total Bilirubin: 0.4 mg/dL (ref 0.3–1.2)
Total Protein: 7.4 g/dL (ref 6.5–8.1)

## 2020-09-03 LAB — TROPONIN I (HIGH SENSITIVITY)
Troponin I (High Sensitivity): 6 ng/L (ref <18)
Troponin I (High Sensitivity): 3 ng/L (ref <18)

## 2020-09-03 LAB — RESPIRATORY PANEL BY RT PCR (FLU A&B, COVID)
Influenza A by PCR: NEGATIVE
Influenza B by PCR: NEGATIVE
SARS Coronavirus 2 by RT PCR: NEGATIVE

## 2020-09-03 LAB — CBC WITH DIFFERENTIAL/PLATELET
Abs Immature Granulocytes: 0.04 10*3/uL (ref 0.00–0.07)
Basophils Absolute: 0.1 10*3/uL (ref 0.0–0.1)
Basophils Relative: 1 %
Eosinophils Absolute: 0.1 10*3/uL (ref 0.0–0.5)
Eosinophils Relative: 1 %
HCT: 37.6 % (ref 36.0–46.0)
Hemoglobin: 11.8 g/dL — ABNORMAL LOW (ref 12.0–15.0)
Immature Granulocytes: 0 %
Lymphocytes Relative: 28 %
Lymphs Abs: 2.8 10*3/uL (ref 0.7–4.0)
MCH: 26.4 pg (ref 26.0–34.0)
MCHC: 31.4 g/dL (ref 30.0–36.0)
MCV: 84.1 fL (ref 80.0–100.0)
Monocytes Absolute: 0.6 10*3/uL (ref 0.1–1.0)
Monocytes Relative: 6 %
Neutro Abs: 6.5 10*3/uL (ref 1.7–7.7)
Neutrophils Relative %: 64 %
Platelets: 458 10*3/uL — ABNORMAL HIGH (ref 150–400)
RBC: 4.47 MIL/uL (ref 3.87–5.11)
RDW: 13.2 % (ref 11.5–15.5)
WBC: 10.2 10*3/uL (ref 4.0–10.5)
nRBC: 0 % (ref 0.0–0.2)

## 2020-09-03 LAB — I-STAT BETA HCG BLOOD, ED (MC, WL, AP ONLY): I-stat hCG, quantitative: <5 m[IU]/mL (ref <5)

## 2020-09-03 MED ORDER — KETOROLAC TROMETHAMINE 15 MG/ML IJ SOLN
15.0000 mg | Freq: Once | INTRAMUSCULAR | Status: AC
  Administered 2020-09-03: 15 mg via INTRAMUSCULAR
  Filled 2020-09-03: qty 1

## 2020-09-03 MED ORDER — ACETAMINOPHEN 500 MG PO TABS
1000.0000 mg | ORAL_TABLET | Freq: Once | ORAL | Status: AC
Start: 2020-09-03 — End: 2020-09-03
  Administered 2020-09-03: 1000 mg via ORAL
  Filled 2020-09-03: qty 2

## 2020-09-03 NOTE — Discharge Instructions (Signed)
Please return for any problem.  Follow-up with your regular care provider as instructed. °

## 2020-09-03 NOTE — ED Triage Notes (Signed)
Pt c/o headache, fever, chills and chest pain xs 1 week  Sts headache worse today.  Pt has not had covid vaccines
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
## 2020-10-16 DIAGNOSIS — Z7189 Other specified counseling: Secondary | ICD-10-CM | POA: Diagnosis not present

## 2020-10-16 DIAGNOSIS — R0989 Other specified symptoms and signs involving the circulatory and respiratory systems: Secondary | ICD-10-CM | POA: Diagnosis not present

## 2020-11-25 DIAGNOSIS — U071 COVID-19: Secondary | ICD-10-CM

## 2020-11-25 HISTORY — DX: COVID-19: U07.1

## 2021-01-18 DIAGNOSIS — R3989 Other symptoms and signs involving the genitourinary system: Secondary | ICD-10-CM | POA: Diagnosis not present

## 2021-01-25 DIAGNOSIS — R1013 Epigastric pain: Secondary | ICD-10-CM | POA: Diagnosis not present

## 2021-01-26 ENCOUNTER — Other Ambulatory Visit
Admission: RE | Admit: 2021-01-26 | Discharge: 2021-01-26 | Disposition: A | Discharge status: Home / Self Care | Payer: BC Managed Care – PPO | Source: Ambulatory Visit | Attending: Student | Admitting: Student

## 2021-01-26 DIAGNOSIS — M25512 Pain in left shoulder: Secondary | ICD-10-CM | POA: Diagnosis not present

## 2021-01-26 DIAGNOSIS — R0789 Other chest pain: Secondary | ICD-10-CM | POA: Insufficient documentation

## 2021-01-26 DIAGNOSIS — R059 Cough, unspecified: Secondary | ICD-10-CM | POA: Diagnosis not present

## 2021-01-26 DIAGNOSIS — K219 Gastro-esophageal reflux disease without esophagitis: Secondary | ICD-10-CM | POA: Diagnosis not present

## 2021-01-26 DIAGNOSIS — R079 Chest pain, unspecified: Secondary | ICD-10-CM | POA: Diagnosis not present

## 2021-01-26 DIAGNOSIS — M19012 Primary osteoarthritis, left shoulder: Secondary | ICD-10-CM | POA: Diagnosis not present

## 2021-01-26 DIAGNOSIS — J9811 Atelectasis: Secondary | ICD-10-CM | POA: Diagnosis not present

## 2021-01-26 LAB — TROPONIN I (HIGH SENSITIVITY): Troponin I (High Sensitivity): 3 ng/L (ref <18)

## 2021-01-26 LAB — D-DIMER, QUANTITATIVE: D-Dimer, Quant: 0.63 ug/mL-FEU — ABNORMAL HIGH (ref 0.00–0.50)

## 2021-01-27 ENCOUNTER — Emergency Department (HOSPITAL_BASED_OUTPATIENT_CLINIC_OR_DEPARTMENT_OTHER): Payer: BC Managed Care – PPO

## 2021-01-27 ENCOUNTER — Other Ambulatory Visit: Payer: Self-pay

## 2021-01-27 ENCOUNTER — Emergency Department (HOSPITAL_BASED_OUTPATIENT_CLINIC_OR_DEPARTMENT_OTHER)
Admission: EM | Admit: 2021-01-27 | Discharge: 2021-01-27 | Disposition: A | Discharge status: Home / Self Care | Payer: BC Managed Care – PPO | Attending: Emergency Medicine | Admitting: Emergency Medicine

## 2021-01-27 ENCOUNTER — Encounter (HOSPITAL_BASED_OUTPATIENT_CLINIC_OR_DEPARTMENT_OTHER): Payer: Self-pay | Admitting: Emergency Medicine

## 2021-01-27 DIAGNOSIS — R918 Other nonspecific abnormal finding of lung field: Secondary | ICD-10-CM | POA: Insufficient documentation

## 2021-01-27 DIAGNOSIS — R079 Chest pain, unspecified: Secondary | ICD-10-CM | POA: Insufficient documentation

## 2021-01-27 DIAGNOSIS — R799 Abnormal finding of blood chemistry, unspecified: Secondary | ICD-10-CM | POA: Diagnosis not present

## 2021-01-27 MED ORDER — IOHEXOL 350 MG/ML SOLN
100.0000 mL | Freq: Once | INTRAVENOUS | Status: AC | PRN
  Administered 2021-01-27: 100 mL via INTRAVENOUS

## 2021-01-27 NOTE — ED Notes (Signed)
Patient transported to CT 
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
## 2021-01-27 NOTE — Discharge Instructions (Addendum)
We will likely need a PET scan to help evaluate the mass in the lung.  Follow-up with your doctor or pulmonary for this.

## 2021-01-27 NOTE — ED Triage Notes (Signed)
Pt seen at [facility] yesterday for chest pain, patient was told to come here for elevated D-dimer and need for CT scan. Pt reports right sided chest pain only when she swallows food or liquids. Pt denies shortness of breath, N/V, diaphoresis or swelling in feet or ankles.

## 2021-01-29 DIAGNOSIS — R918 Other nonspecific abnormal finding of lung field: Secondary | ICD-10-CM | POA: Diagnosis not present

## 2021-02-12 ENCOUNTER — Encounter: Payer: Self-pay | Admitting: Emergency Medicine

## 2021-02-12 ENCOUNTER — Ambulatory Visit (INDEPENDENT_AMBULATORY_CARE_PROVIDER_SITE_OTHER): Payer: BC Managed Care – PPO | Admitting: Emergency Medicine

## 2021-02-12 ENCOUNTER — Other Ambulatory Visit: Payer: Self-pay

## 2021-02-12 DIAGNOSIS — R911 Solitary pulmonary nodule: Secondary | ICD-10-CM

## 2021-02-12 DIAGNOSIS — R059 Cough, unspecified: Secondary | ICD-10-CM | POA: Diagnosis not present

## 2021-02-12 DIAGNOSIS — D869 Sarcoidosis, unspecified: Secondary | ICD-10-CM | POA: Insufficient documentation

## 2021-02-12 NOTE — Assessment & Plan Note (Signed)
Rhinitis probably a contributor although she has been on loratadine before and it did not seem to help very much.  She could consider restarting.  I think we will need to ultimately send culture data since she has gray mucus that she clears this morning.  Suspect that she will have a bronchoscopy at which time we can obtain micro

## 2021-02-12 NOTE — Addendum Note (Signed)
Addended by: Dorisann Frames R on: 02/12/2021 04:51 PM   Modules accepted: Orders

## 2021-02-12 NOTE — Assessment & Plan Note (Signed)
Newly identified right lower lobe irregularly shaped pulmonary nodule, question mixed density.  She may also have some subtle right hilar lymphadenopathy.  Etiology unclear.  She should be low risk for malignancy.  Consider inflammatory process, sarcoidosis.  She does not have any signs or symptoms of a pneumonia and this does not look like atelectasis.  I think she needs a PET scan to further characterize and then probably bronchoscopy depending on results.  We will get the PET and then follow-up to review.

## 2021-02-12 NOTE — Progress Notes (Signed)
Subjective:    Patient ID: Kari Rodriguez, female    DOB: 1990-12-15, 30 y.o.   MRN: [id number]  HPI 30 year old never smoker, little past medical history, has tension headaches.  She is referred today for evaluation of coughing.   She started to have cough beginning about a year ago, almost every morning, clears yellowish gray mucous in the am, sometimes harder plugs. She also coughs during the rest of the day but less productive. There may be some water damage in her bathroom under sink, ? Mold exposure. She feels some drainage down down her throat, no globus sensation, doesn't really have to clear her throat. No CP, no wheeze or stridor. She has used a humidifier, Vicks, mucinex before. The cough has waxed and waned some, has had some breaks for a few months. No GERD sx. No nocturnal awakenings.   Chest x-ray 12/09/2019 reviewed by me, showed some slight left midlung atelectasis, normal heart shadow, otherwise clear.   ROV 02/12/21 --follow-up visit with 30 year old woman with history of tension headaches whom I have seen for chronic cough with an upper airway component.  She has some left midlung linear opacity, suspect to be atelectasis.  Pulmonary function testing 02/2020 showed normal spirometry without a bronchodilator response, normal diffusion capacity.  She is being treated for chronic rhinitis, GERD. She has cough in the am, produces some gray mucous plugs.   She was seen in the emergency department 01/27/2021 for chest discomfort and had a CT scan of her chest performed which I have reviewed.  This shows no evidence of pulmonary embolism, no mediastinal adenopathy, a right lower lobe soft tissue lesion 2.2 cm in largest dimension and stable lingular subsegmental atelectasis looks like there may be some right hilar fullness although no discrete lymphadenopathy was identified in the report.   MDM: Reviewed ED notes from 01/27/2021   Review of Systems As per HPI      Objective:    Physical Exam Vitals:   02/12/21 1528  BP: 120/68  Pulse: 87  Temp: 98.3 F (36.8 C)  TempSrc: Temporal  SpO2: 100%  Weight: 297 lb 6.4 oz (134.9 kg)  Height: 5\' 11"  (1.803 m)   Gen: Pleasant, obese woman, in no distress,  normal affect  ENT: No lesions,  mouth clear,  oropharynx clear, no postnasal drip  Neck: No JVD, no stridor  Lungs: No use of accessory muscles, distant, no crackles or wheezing on normal respiration, no wheeze on forced expiration  Cardiovascular: RRR, heart sounds normal, no murmur or gallops, no peripheral edema  Musculoskeletal: No deformities, no cyanosis or clubbing  Neuro: alert, awake, non focal  Skin: Warm, no lesions or rash       Assessment & Plan:  Pulmonary nodule Newly identified right lower lobe irregularly shaped pulmonary nodule, question mixed density.  She may also have some subtle right hilar lymphadenopathy.  Etiology unclear.  She should be low risk for malignancy.  Consider inflammatory process, sarcoidosis.  She does not have any signs or symptoms of a pneumonia and this does not look like atelectasis.  I think she needs a PET scan to further characterize and then probably bronchoscopy depending on results.  We will get the PET and then follow-up to review.  Cough Rhinitis probably a contributor although she has been on loratadine before and it did not seem to help very much.  She could consider restarting.  I think we will need to ultimately send culture data since she has gray  mucus that she clears this morning.  Suspect that she will have a bronchoscopy at which time we can obtain micro  Levy Pupa, MD, PhD 02/12/2021, 4:13 PM [facility] [phone] or if no answer [phone]

## 2021-02-12 NOTE — Patient Instructions (Signed)
We will arrange for a PET scan to further evaluate your pulmonary nodule. You may benefit from restarting your loratadine 10mg  once daily. Please follow with Dr. next available after your PET scan so that we can review results and plan next steps

## 2021-02-27 ENCOUNTER — Other Ambulatory Visit: Payer: Self-pay

## 2021-02-27 ENCOUNTER — Ambulatory Visit (HOSPITAL_COMMUNITY)
Admission: RE | Admit: 2021-02-27 | Discharge: 2021-02-27 | Disposition: A | Discharge status: Home / Self Care | Payer: BC Managed Care – PPO | Source: Ambulatory Visit | Attending: Emergency Medicine | Admitting: Emergency Medicine

## 2021-02-27 DIAGNOSIS — R911 Solitary pulmonary nodule: Secondary | ICD-10-CM | POA: Diagnosis not present

## 2021-02-27 LAB — GLUCOSE, CAPILLARY: Glucose-Capillary: 84 mg/dL (ref 70–99)

## 2021-02-27 MED ORDER — FLUDEOXYGLUCOSE F - 18 (FDG) INJECTION
12.0000 | Freq: Once | INTRAVENOUS | Status: AC | PRN
  Administered 2021-02-27: 16.43 via INTRAVENOUS

## 2021-03-16 DIAGNOSIS — N898 Other specified noninflammatory disorders of vagina: Secondary | ICD-10-CM | POA: Diagnosis not present

## 2021-03-23 ENCOUNTER — Encounter: Payer: Self-pay | Admitting: Emergency Medicine

## 2021-03-23 ENCOUNTER — Other Ambulatory Visit: Payer: Self-pay

## 2021-03-23 ENCOUNTER — Ambulatory Visit (INDEPENDENT_AMBULATORY_CARE_PROVIDER_SITE_OTHER): Payer: BC Managed Care – PPO | Admitting: Emergency Medicine

## 2021-03-23 VITALS — BP 122/80 | HR 76 | Temp 97.4°F | Ht 71.0 in | Wt 296.8 lb

## 2021-03-23 DIAGNOSIS — R911 Solitary pulmonary nodule: Secondary | ICD-10-CM

## 2021-03-23 LAB — CBC WITH DIFFERENTIAL/PLATELET
Basophils Absolute: 0.1 10*3/uL (ref 0.0–0.1)
Basophils Relative: 0.9 % (ref 0.0–3.0)
Eosinophils Absolute: 0.2 10*3/uL (ref 0.0–0.7)
Eosinophils Relative: 2.5 % (ref 0.0–5.0)
HCT: 36.4 % (ref 36.0–46.0)
Hemoglobin: 11.9 g/dL — ABNORMAL LOW (ref 12.0–15.0)
Lymphocytes Relative: 31.9 % (ref 12.0–46.0)
Lymphs Abs: 2.6 10*3/uL (ref 0.7–4.0)
MCHC: 32.7 g/dL (ref 30.0–36.0)
MCV: 81.6 fl (ref 78.0–100.0)
Monocytes Absolute: 0.7 10*3/uL (ref 0.1–1.0)
Monocytes Relative: 8.3 % (ref 3.0–12.0)
Neutro Abs: 4.7 10*3/uL (ref 1.4–7.7)
Neutrophils Relative %: 56.4 % (ref 43.0–77.0)
Platelets: 433 10*3/uL — ABNORMAL HIGH (ref 150.0–400.0)
RBC: 4.46 Mil/uL (ref 3.87–5.11)
RDW: 13.9 % (ref 11.5–15.5)
WBC: 8.3 10*3/uL (ref 4.0–10.5)

## 2021-03-23 LAB — BASIC METABOLIC PANEL
BUN: 11 mg/dL (ref 6–23)
CO2: 24 mEq/L (ref 19–32)
Calcium: 9.1 mg/dL (ref 8.4–10.5)
Chloride: 103 mEq/L (ref 96–112)
Creatinine, Ser: 0.93 mg/dL (ref 0.40–1.20)
GFR: 81.47 mL/min (ref >60.00)
Glucose, Bld: 98 mg/dL (ref 70–99)
Potassium: 4.4 mEq/L (ref 3.5–5.1)
Sodium: 135 mEq/L (ref 135–145)

## 2021-03-23 LAB — PROTIME-INR
INR: 1 ratio (ref 0.8–1.0)
Prothrombin Time: 11 s (ref 9.6–13.1)

## 2021-03-23 NOTE — Assessment & Plan Note (Signed)
Discussed CT scan and PET scan results in detail with her today.  No hypermetabolism on the 2 cm nodule but it does persist.  Discussed watchful waiting, serial imaging versus tissue diagnosis.  She like to pursue bronchoscopy.  We will arrange for navigational bronchoscopy, goal on 04/09/2021.  Discussed the procedure, risk, benefits with her and she elected proceed.

## 2021-03-23 NOTE — Addendum Note (Signed)
Addended by: Dorisann Frames R on: 03/23/2021 09:49 AM   Modules accepted: Orders

## 2021-03-23 NOTE — Progress Notes (Signed)
   Subjective:    Patient ID: Kari Rodriguez, female    DOB: [birth year], 30 y.o.   MRN: [id number]  HPI  ROV 02/12/21 --follow-up visit with 30 year old woman with history of tension headaches whom I have seen for chronic cough with an upper airway component.  She has some left midlung linear opacity, suspect to be atelectasis.  Pulmonary function testing 02/2020 showed normal spirometry without a bronchodilator response, normal diffusion capacity.  She is being treated for chronic rhinitis, GERD. She has cough in the am, produces some gray mucous plugs.   She was seen in the emergency department 01/27/2021 for chest discomfort and had a CT scan of her chest performed which I have reviewed.  This shows no evidence of pulmonary embolism, no mediastinal adenopathy, a right lower lobe soft tissue lesion 2.2 cm in largest dimension and stable lingular subsegmental atelectasis looks like there may be some right hilar fullness although no discrete lymphadenopathy was identified in the report  ROV 03/23/21 --30 year old woman whom I follow for chronic cough in the setting of GERD, chronic rhinitis.  She is a chest x-ray in the past that showed left midlung atelectasis.  A 2 cm rounded right lower lobe soft tissue density was noted on CT chest 01/27/2021, unclear etiology.  Prompted PET scan as below.  PET scan performed 02/28/2021 reviewed by me, shows that the 2.0 cm lobulated medial right lower lobe nodule is stable in size and is not hypermetabolic.  There is no hypermetabolic hilar or mediastinal lymphadenopathy.  There is a subcentimeter left axillary lymph node that has mild uptake (SUV 1.8), small external iliac and bilateral inguinal nodes with some moderate hypermetabolism also noted, question inflammatory.   Review of Systems As per HPI     Objective:   Physical Exam Vitals:   03/23/21 0918  BP: 122/80  Pulse: 76  Temp: (!) 97.4 F (36.3 C)  TempSrc: Temporal  SpO2: 99%  Weight: 296 lb  12.8 oz (134.6 kg)  Height: 5\' 11"  (1.803 m)   Gen: Pleasant, obese woman, in no distress,  normal affect  ENT: No lesions,  mouth clear,  oropharynx clear, no postnasal drip  Neck: No JVD, no stridor  Lungs: No use of accessory muscles, distant, no crackles or wheezing on normal respiration, no wheeze on forced expiration  Cardiovascular: RRR, heart sounds normal, no murmur or gallops, no peripheral edema  Musculoskeletal: No deformities, no cyanosis or clubbing  Neuro: alert, awake, non focal  Skin: Warm, no lesions or rash       Assessment & Plan:  Pulmonary nodule Discussed CT scan and PET scan results in detail with her today.  No hypermetabolism on the 2 cm nodule but it does persist.  Discussed watchful waiting, serial imaging versus tissue diagnosis.  She like to pursue bronchoscopy.  We will arrange for navigational bronchoscopy, goal on 04/09/2021.  Discussed the procedure, risk, benefits with her and she elected proceed.  04/11/2021, MD, PhD 03/23/2021, 9:46 AM [facility] [phone] or if no answer [phone]

## 2021-03-23 NOTE — H&P (View-Only) (Signed)
   Subjective:    Patient ID: Kari Rodriguez, female    DOB: 08/14/1991, 30 y.o.   MRN: [id number]  HPI  ROV 02/12/21 --follow-up visit with 30-year-old woman with history of tension headaches whom I have seen for chronic cough with an upper airway component.  She has some left midlung linear opacity, suspect to be atelectasis.  Pulmonary function testing 02/2020 showed normal spirometry without a bronchodilator response, normal diffusion capacity.  She is being treated for chronic rhinitis, GERD. She has cough in the am, produces some gray mucous plugs.   She was seen in the emergency department 01/27/2021 for chest discomfort and had a CT scan of her chest performed which I have reviewed.  This shows no evidence of pulmonary embolism, no mediastinal adenopathy, a right lower lobe soft tissue lesion 2.2 cm in largest dimension and stable lingular subsegmental atelectasis looks like there may be some right hilar fullness although no discrete lymphadenopathy was identified in the report  ROV 03/23/21 --30-year-old woman whom I follow for chronic cough in the setting of GERD, chronic rhinitis.  She is a chest x-ray in the past that showed left midlung atelectasis.  A 2 cm rounded right lower lobe soft tissue density was noted on CT chest 01/27/2021, unclear etiology.  Prompted PET scan as below.  PET scan performed 02/28/2021 reviewed by me, shows that the 2.0 cm lobulated medial right lower lobe nodule is stable in size and is not hypermetabolic.  There is no hypermetabolic hilar or mediastinal lymphadenopathy.  There is a subcentimeter left axillary lymph node that has mild uptake (SUV 1.8), small external iliac and bilateral inguinal nodes with some moderate hypermetabolism also noted, question inflammatory.   Review of Systems As per HPI     Objective:   Physical Exam Vitals:   03/23/21 0918  BP: 122/80  Pulse: 76  Temp: (!) 97.4 F (36.3 C)  TempSrc: Temporal  SpO2: 99%  Weight: 296 lb  12.8 oz (134.6 kg)  Height: 5' 11" (1.803 m)   Gen: Pleasant, obese woman, in no distress,  normal affect  ENT: No lesions,  mouth clear,  oropharynx clear, no postnasal drip  Neck: No JVD, no stridor  Lungs: No use of accessory muscles, distant, no crackles or wheezing on normal respiration, no wheeze on forced expiration  Cardiovascular: RRR, heart sounds normal, no murmur or gallops, no peripheral edema  Musculoskeletal: No deformities, no cyanosis or clubbing  Neuro: alert, awake, non focal  Skin: Warm, no lesions or rash       Assessment & Plan:  Pulmonary nodule Discussed CT scan and PET scan results in detail with her today.  No hypermetabolism on the 2 cm nodule but it does persist.  Discussed watchful waiting, serial imaging versus tissue diagnosis.  She like to pursue bronchoscopy.  We will arrange for navigational bronchoscopy, goal on 04/09/2021.  Discussed the procedure, risk, benefits with her and she elected proceed.  Krysteena Stalker, MD, PhD 03/23/2021, 9:46 AM [facility] [phone] or if no answer [phone]  

## 2021-03-23 NOTE — Addendum Note (Signed)
Addended by: Demetrio Lapping E on: 03/23/2021 09:52 AM   Modules accepted: Orders

## 2021-03-23 NOTE — Patient Instructions (Signed)
We will arrange for navigational bronchoscopy at [facility] endoscopy.  Goal will be to have this on 04/09/2021.  We will contact you with the final details.  You will need a designated driver as this will be done under general anesthesia. Follow with Dr Delton Coombes in 1 month

## 2021-03-23 NOTE — Addendum Note (Signed)
Addended by: Dorisann Frames R on: 03/23/2021 04:47 PM   Modules accepted: Orders

## 2021-03-26 ENCOUNTER — Telehealth: Payer: Self-pay | Admitting: Emergency Medicine

## 2021-04-03 ENCOUNTER — Ambulatory Visit (INDEPENDENT_AMBULATORY_CARE_PROVIDER_SITE_OTHER)
Admission: RE | Admit: 2021-04-03 | Discharge: 2021-04-03 | Disposition: A | Discharge status: Home / Self Care | Payer: BC Managed Care – PPO | Source: Ambulatory Visit | Attending: Emergency Medicine | Admitting: Emergency Medicine

## 2021-04-03 ENCOUNTER — Telehealth: Payer: Self-pay | Admitting: Neurology

## 2021-04-03 ENCOUNTER — Other Ambulatory Visit: Payer: Self-pay

## 2021-04-03 DIAGNOSIS — R918 Other nonspecific abnormal finding of lung field: Secondary | ICD-10-CM | POA: Diagnosis not present

## 2021-04-03 DIAGNOSIS — R911 Solitary pulmonary nodule: Secondary | ICD-10-CM

## 2021-04-03 MED ORDER — RIZATRIPTAN BENZOATE 10 MG PO TBDP: 10.0000 mg | ORAL_TABLET | ORAL | 0 refills | Status: DC | PRN

## 2021-04-03 NOTE — Telephone Encounter (Signed)
Pt called wanting to know if she has anymore refills on her rizatriptan (MAXALT-MLT) 10 MG disintegrating tablet and if so if it can be called in for her to the [facility] on  [address]. Please advise.

## 2021-04-03 NOTE — Telephone Encounter (Signed)
We can send in one refill but the patient hasn't been seen in a year. Message placed on Rx bottle for pt to schedule appt and a mychart message was sent to pt.

## 2021-04-05 ENCOUNTER — Encounter (HOSPITAL_COMMUNITY): Payer: Self-pay | Admitting: Emergency Medicine

## 2021-04-05 NOTE — Progress Notes (Signed)
EKG: 09/05/20 and 01/26/21 in [facility].  Requested EKG tracing. CXR: 09/03/20 ECHO: denies Stress Test: denies Cardiac Cath: denies  Fasting Blood Sugar- na Checks Blood Sugar__na_ times a day  OSA/CPAP: No  ASA/Blood Thinners: No  Covid test 04/06/21  Anesthesia Review:  Patient seen 01/26/21 for atypical chest pain as noted in CE, but ended up not being cardiac related per patient.   Patient denies shortness of breath, fever, cough, and chest pain at PAT appointment.  Patient verbalized understanding of instructions provided today at the PAT appointment.  Patient asked to review instructions at home and day of surgery.

## 2021-04-06 ENCOUNTER — Other Ambulatory Visit (HOSPITAL_COMMUNITY)
Admission: RE | Admit: 2021-04-06 | Discharge: 2021-04-06 | Disposition: A | Discharge status: Home / Self Care | Payer: BC Managed Care – PPO | Source: Ambulatory Visit | Attending: Emergency Medicine | Admitting: Emergency Medicine

## 2021-04-06 DIAGNOSIS — Z01812 Encounter for preprocedural laboratory examination: Secondary | ICD-10-CM | POA: Insufficient documentation

## 2021-04-06 DIAGNOSIS — Z20822 Contact with and (suspected) exposure to covid-19: Secondary | ICD-10-CM | POA: Insufficient documentation

## 2021-04-06 DIAGNOSIS — R911 Solitary pulmonary nodule: Secondary | ICD-10-CM | POA: Diagnosis not present

## 2021-04-06 DIAGNOSIS — K219 Gastro-esophageal reflux disease without esophagitis: Secondary | ICD-10-CM | POA: Diagnosis not present

## 2021-04-06 DIAGNOSIS — J310 Chronic rhinitis: Secondary | ICD-10-CM | POA: Diagnosis not present

## 2021-04-07 LAB — SARS CORONAVIRUS 2 (TAT 6-24 HRS): SARS Coronavirus 2: NEGATIVE

## 2021-04-09 ENCOUNTER — Ambulatory Visit (HOSPITAL_COMMUNITY): Payer: BC Managed Care – PPO | Admitting: Certified Registered Nurse Anesthetist

## 2021-04-09 ENCOUNTER — Ambulatory Visit (HOSPITAL_COMMUNITY): Payer: BC Managed Care – PPO

## 2021-04-09 ENCOUNTER — Encounter (HOSPITAL_COMMUNITY): Payer: Self-pay | Admitting: Emergency Medicine

## 2021-04-09 ENCOUNTER — Other Ambulatory Visit: Payer: Self-pay

## 2021-04-09 ENCOUNTER — Encounter (HOSPITAL_COMMUNITY)
Admission: RE | Disposition: A | Discharge status: Home / Self Care | Payer: Self-pay | Source: Home / Self Care | Attending: Emergency Medicine

## 2021-04-09 ENCOUNTER — Ambulatory Visit (HOSPITAL_COMMUNITY)
Admission: RE | Admit: 2021-04-09 | Discharge: 2021-04-09 | Disposition: A | Discharge status: Home / Self Care | Payer: BC Managed Care – PPO | Attending: Emergency Medicine | Admitting: Emergency Medicine

## 2021-04-09 DIAGNOSIS — K219 Gastro-esophageal reflux disease without esophagitis: Secondary | ICD-10-CM | POA: Diagnosis not present

## 2021-04-09 DIAGNOSIS — R918 Other nonspecific abnormal finding of lung field: Secondary | ICD-10-CM | POA: Diagnosis not present

## 2021-04-09 DIAGNOSIS — Z419 Encounter for procedure for purposes other than remedying health state, unspecified: Secondary | ICD-10-CM | POA: Diagnosis not present

## 2021-04-09 DIAGNOSIS — Z20822 Contact with and (suspected) exposure to covid-19: Secondary | ICD-10-CM | POA: Insufficient documentation

## 2021-04-09 DIAGNOSIS — D869 Sarcoidosis, unspecified: Secondary | ICD-10-CM | POA: Diagnosis present

## 2021-04-09 DIAGNOSIS — J9811 Atelectasis: Secondary | ICD-10-CM | POA: Diagnosis not present

## 2021-04-09 DIAGNOSIS — G43009 Migraine without aura, not intractable, without status migrainosus: Secondary | ICD-10-CM | POA: Diagnosis not present

## 2021-04-09 DIAGNOSIS — R911 Solitary pulmonary nodule: Secondary | ICD-10-CM | POA: Insufficient documentation

## 2021-04-09 DIAGNOSIS — L709 Acne, unspecified: Secondary | ICD-10-CM | POA: Diagnosis not present

## 2021-04-09 DIAGNOSIS — J8410 Pulmonary fibrosis, unspecified: Secondary | ICD-10-CM | POA: Diagnosis not present

## 2021-04-09 DIAGNOSIS — I509 Heart failure, unspecified: Secondary | ICD-10-CM | POA: Diagnosis not present

## 2021-04-09 DIAGNOSIS — J310 Chronic rhinitis: Secondary | ICD-10-CM | POA: Insufficient documentation

## 2021-04-09 HISTORY — PX: VIDEO BRONCHOSCOPY WITH ENDOBRONCHIAL NAVIGATION: SHX6175

## 2021-04-09 HISTORY — PX: BRONCHIAL BRUSHINGS: SHX5108

## 2021-04-09 HISTORY — PX: BRONCHIAL NEEDLE ASPIRATION BIOPSY: SHX5106

## 2021-04-09 HISTORY — DX: Other specified health status: Z78.9

## 2021-04-09 HISTORY — PX: BRONCHIAL BIOPSY: SHX5109

## 2021-04-09 LAB — POCT PREGNANCY, URINE: Preg Test, Ur: NEGATIVE

## 2021-04-09 SURGERY — VIDEO BRONCHOSCOPY WITH ENDOBRONCHIAL NAVIGATION
Anesthesia: General

## 2021-04-09 MED ORDER — ROCURONIUM BROMIDE 100 MG/10ML IV SOLN
INTRAVENOUS | Status: DC | PRN
  Administered 2021-04-09: 60 mg via INTRAVENOUS

## 2021-04-09 MED ORDER — SUGAMMADEX SODIUM 200 MG/2ML IV SOLN
INTRAVENOUS | Status: DC | PRN
  Administered 2021-04-09: 400 mg via INTRAVENOUS

## 2021-04-09 MED ORDER — CHLORHEXIDINE GLUCONATE 0.12 % MT SOLN
OROMUCOSAL | Status: AC
  Administered 2021-04-09: 15 mL
  Filled 2021-04-09: qty 15

## 2021-04-09 MED ORDER — ONDANSETRON HCL 4 MG/2ML IJ SOLN: 4.0000 mg | Freq: Four times a day (QID) | INTRAMUSCULAR | Status: DC | PRN

## 2021-04-09 MED ORDER — OXYCODONE HCL 5 MG/5ML PO SOLN
5.0000 mg | Freq: Once | ORAL | Status: DC | PRN
Start: 2021-04-09 — End: 2021-04-09

## 2021-04-09 MED ORDER — OXYCODONE HCL 5 MG PO TABS: 5.0000 mg | ORAL_TABLET | Freq: Once | ORAL | Status: DC | PRN

## 2021-04-09 MED ORDER — FENTANYL CITRATE (PF) 250 MCG/5ML IJ SOLN
INTRAMUSCULAR | Status: DC | PRN
  Administered 2021-04-09: 100 ug via INTRAVENOUS

## 2021-04-09 MED ORDER — LIDOCAINE 2% (20 MG/ML) 5 ML SYRINGE
INTRAMUSCULAR | Status: DC | PRN
  Administered 2021-04-09: 60 mg via INTRAVENOUS

## 2021-04-09 MED ORDER — LACTATED RINGERS IV SOLN: INTRAVENOUS | Status: DC

## 2021-04-09 MED ORDER — FENTANYL CITRATE (PF) 100 MCG/2ML IJ SOLN: 25.0000 ug | INTRAMUSCULAR | Status: DC | PRN

## 2021-04-09 MED ORDER — DEXAMETHASONE SODIUM PHOSPHATE 10 MG/ML IJ SOLN
INTRAMUSCULAR | Status: DC | PRN
  Administered 2021-04-09: 5 mg via INTRAVENOUS

## 2021-04-09 MED ORDER — MIDAZOLAM HCL 5 MG/5ML IJ SOLN
INTRAMUSCULAR | Status: DC | PRN
  Administered 2021-04-09: 2 mg via INTRAVENOUS

## 2021-04-09 MED ORDER — ONDANSETRON HCL 4 MG/2ML IJ SOLN
INTRAMUSCULAR | Status: DC | PRN
  Administered 2021-04-09: 4 mg via INTRAVENOUS

## 2021-04-09 MED ORDER — PROPOFOL 10 MG/ML IV BOLUS
INTRAVENOUS | Status: DC | PRN
  Administered 2021-04-09: 180 mg via INTRAVENOUS
  Administered 2021-04-09: 20 mg via INTRAVENOUS

## 2021-04-09 NOTE — Discharge Instructions (Signed)
Flexible Bronchoscopy, Care After This sheet gives you information about how to care for yourself after your test. Your doctor may also give you more specific instructions. If you have problems or questions, contact your doctor. Follow these instructions at home: Eating and drinking  Do not eat or drink anything (not even water) for 2 hours after your test, or until your numbing medicine (local anesthetic) wears off.  When your numbness is gone and your cough and gag reflexes have come back, you may: ? Eat only soft foods. ? Slowly drink liquids.  The day after the test, go back to your normal diet. Driving  Do not drive for 24 hours if you were given a medicine to help you relax (sedative).  Do not drive or use heavy machinery while taking prescription pain medicine. General instructions   Take over-the-counter and prescription medicines only as told by your doctor.  Return to your normal activities as told. Ask what activities are safe for you.  Do not use any products that have nicotine or tobacco in them. This includes cigarettes and e-cigarettes. If you need help quitting, ask your doctor.  Keep all follow-up visits as told by your doctor. This is important. It is very important if you had a tissue sample (biopsy) taken. Get help right away if:  You have shortness of breath that gets worse.  You get light-headed.  You feel like you are going to pass out (faint).  You have chest pain.  You cough up: ? More than a little blood. ? More blood than before. Summary  Do not eat or drink anything (not even water) for 2 hours after your test, or until your numbing medicine wears off.  Do not use cigarettes. Do not use e-cigarettes.  Get help right away if you have chest pain.   Please call our office for any questions or concerns.  [phone].  This information is not intended to replace advice given to you by your health care provider. Make sure you discuss any  questions you have with your health care provider. Document Released: 09/08/2009 Document Revised: 10/24/2017 Document Reviewed: 11/29/2016 Elsevier Patient Education  2020 [employer].

## 2021-04-09 NOTE — Telephone Encounter (Signed)
Pt had super D CT performed 5/10 and had bronch performed today 5/16. Will close encounter.

## 2021-04-09 NOTE — Interval H&P Note (Signed)
History and Physical Interval Note:  04/09/2021 7:42 AM  Kari Rodriguez  has presented today for surgery, with the diagnosis of RIGHT LOWER LOPE PULMORANARY NODULE.  The various methods of treatment have been discussed with the patient and family. After consideration of risks, benefits and other options for treatment, the patient has consented to  Procedure(s): VIDEO BRONCHOSCOPY WITH ENDOBRONCHIAL NAVIGATION (N/A) as a surgical intervention.  The patient's history has been reviewed, patient examined, no change in status, stable for surgery.  I have reviewed the patient's chart and labs.  Questions were answered to the patient's satisfaction.     Leslye Peer

## 2021-04-09 NOTE — Transfer of Care (Signed)
Immediate Anesthesia Transfer of Care Note  Patient: Kari Rodriguez  Procedure(s) Performed: VIDEO BRONCHOSCOPY WITH ENDOBRONCHIAL NAVIGATION (N/A ) BRONCHIAL BIOPSIES BRONCHIAL BRUSHINGS BRONCHIAL NEEDLE ASPIRATION BIOPSIES  Patient Location: Endoscopy Unit  Anesthesia Type:General  Level of Consciousness: awake and drowsy  Airway & Oxygen Therapy: Patient Spontanous Breathing and Patient connected to face mask oxygen  Post-op Assessment: Report given to RN and Post -op Vital signs reviewed and stable  Post vital signs: Reviewed and stable  Last Vitals: BP126/59, HR 101 SPO2 100% RR 15 Vitals Value Taken Time  BP    Temp    Pulse    Resp    SpO2      Last Pain:  Vitals:   04/09/21 0720  TempSrc:   PainSc: 0-No pain      Patients Stated Pain Goal: 3 (04/09/21 0720)  Complications: No complications documented.

## 2021-04-09 NOTE — Anesthesia Procedure Notes (Addendum)
Procedure Name: Intubation Date/Time: 04/09/2021 10:02 AM Performed by: Janene Harvey, CRNA Pre-anesthesia Checklist: Patient identified, Emergency Drugs available, Suction available and Patient being monitored Patient Re-evaluated:Patient Re-evaluated prior to induction Oxygen Delivery Method: Circle system utilized Preoxygenation: Pre-oxygenation with 100% oxygen Induction Type: IV induction Ventilation: Mask ventilation without difficulty Laryngoscope Size: Mac and 4 Grade View: Grade I Tube type: Oral Tube size: 8.5 mm Number of attempts: 1 Airway Equipment and Method: Stylet and Oral airway Placement Confirmation: ETT inserted through vocal cords under direct vision,  positive ETCO2 and breath sounds checked- equal and bilateral Secured at: 23 cm Tube secured with: Tape Dental Injury: Teeth and Oropharynx as per pre-operative assessment  Comments: By Fernanda Drum

## 2021-04-09 NOTE — Op Note (Signed)
Video Bronchoscopy with Electromagnetic Navigation Procedure Note  Date of Operation: 04/09/2021  Pre-op Diagnosis: Right lower lobe pulmonary nodule  Post-op Diagnosis: Same  Surgeon: Baltazar Apo  Assistants: None  Anesthesia: General endotracheal anesthesia  Operation: Flexible video fiberoptic bronchoscopy with electromagnetic navigation and biopsies.  Estimated Blood Loss: Minimal  Complications: None apparent  Indications and History: Kari Rodriguez is a 30 y.o. female with Little past medical history found to have a right lower lobe pulmonary nodule when she was evaluated for chest discomfort 01/27/2021.  PET negative.  Question some mucus impaction as it was associated with an airway.  Recommendation was made to achieve tissue diagnosis via navigational bronchoscopy.  The risks, benefits, complications, treatment options and expected outcomes were discussed with the patient.  The possibilities of pneumothorax, pneumonia, reaction to medication, pulmonary aspiration, perforation of a viscus, bleeding, failure to diagnose a condition and creating a complication requiring transfusion or operation were discussed with the patient who freely signed the consent.    Description of Procedure: The patient was seen in the Preoperative Area, was examined and was deemed appropriate to proceed.  The patient was taken to [facility] endoscopy room 2, identified as Kari Rodriguez and the procedure verified as Flexible Video Fiberoptic Bronchoscopy.  A Time Out was held and the above information confirmed.   Prior to the date of the procedure a high-resolution CT scan of the chest was performed. Utilizing [employer] a virtual tracheobronchial tree was generated to allow the creation of distinct navigation pathways to the patient's parenchymal abnormalities. After being taken to the operating room general anesthesia was initiated and the patient  was orally intubated. The video  fiberoptic bronchoscope was introduced via the endotracheal tube and a general inspection was performed which showed normal airways throughout.  No endobronchial lesions or abnormal secretions. The extendable working channel and locator guide were introduced into the bronchoscope. The distinct navigation pathways prepared prior to this procedure were then utilized to navigate to within 0.2 cm of patient's lesion identified on CT scan. The extendable working channel was secured into place and the locator guide was withdrawn. Under fluoroscopic guidance transbronchial needle brushings, transbronchial Wang needle biopsies, and transbronchial forceps biopsies were performed to be sent for cytology. At the end of the procedure a general airway inspection was performed and there was no evidence of active bleeding. The bronchoscope was removed.  The patient tolerated the procedure well. There was no significant blood loss and there were no obvious complications. A post-procedural chest x-ray is pending.  Samples: 1. Transbronchial needle brushings from right lower lobe nodule 2. Transbronchial Wang needle biopsies from right lower lobe nodule 3. Transbronchial forceps biopsies from right lower lobe nodule 4. Bronchoalveolar lavage from right lower lobe  Plans:  The patient will be discharged from the PACU to home when recovered from anesthesia and after chest x-ray is reviewed. We will review the cytology, pathology and microbiology results with the patient when they become available. Outpatient followup will be with Dr. Lamonte Sakai.    Mardy Lucier S. 04/09/2021

## 2021-04-09 NOTE — Anesthesia Preprocedure Evaluation (Signed)
Anesthesia Evaluation  Patient identified by MRN, date of birth, ID band Patient awake    Reviewed: Allergy & Precautions, H&P , NPO status , Patient's Chart, lab work & pertinent test results  Airway Mallampati: II   Neck ROM: full    Dental   Pulmonary  RLL lobectomy   breath sounds clear to auscultation       Cardiovascular negative cardio ROS   Rhythm:regular Rate:Normal     Neuro/Psych  Headaches,    GI/Hepatic   Endo/Other  Morbid obesity  Renal/GU      Musculoskeletal   Abdominal   Peds  Hematology   Anesthesia Other Findings   Reproductive/Obstetrics                             Anesthesia Physical Anesthesia Plan  ASA: II  Anesthesia Plan: General   Post-op Pain Management:    Induction: Intravenous  PONV Risk Score and Plan: 3 and Ondansetron, Dexamethasone, Midazolam and Treatment may vary due to age or medical condition  Airway Management Planned: Oral ETT  Additional Equipment:   Intra-op Plan:   Post-operative Plan: Extubation in OR  Informed Consent: I have reviewed the patients History and Physical, chart, labs and discussed the procedure including the risks, benefits and alternatives for the proposed anesthesia with the patient or authorized representative who has indicated his/her understanding and acceptance.     Dental advisory given  Plan Discussed with: CRNA, Anesthesiologist and Surgeon  Anesthesia Plan Comments:         Anesthesia Quick Evaluation

## 2021-04-10 ENCOUNTER — Encounter (HOSPITAL_COMMUNITY): Payer: Self-pay | Admitting: Emergency Medicine

## 2021-04-10 LAB — CYTOLOGY - NON PAP

## 2021-04-10 NOTE — Anesthesia Postprocedure Evaluation (Signed)
Anesthesia Post Note  Patient: Kari Rodriguez  Procedure(s) Performed: VIDEO BRONCHOSCOPY WITH ENDOBRONCHIAL NAVIGATION (N/A ) BRONCHIAL BIOPSIES BRONCHIAL BRUSHINGS BRONCHIAL NEEDLE ASPIRATION BIOPSIES     Patient location during evaluation: PACU Anesthesia Type: General Level of consciousness: awake and alert Pain management: pain level controlled Vital Signs Assessment: post-procedure vital signs reviewed and stable Respiratory status: spontaneous breathing, nonlabored ventilation, respiratory function stable and patient connected to nasal cannula oxygen Cardiovascular status: blood pressure returned to baseline and stable Postop Assessment: no apparent nausea or vomiting Anesthetic complications: no   No complications documented.  Last Vitals:  Vitals:   04/09/21 1110 04/09/21 1120  BP: 118/75 124/65  Pulse: 97 92  Resp: (!) 25 (!) 24  Temp:    SpO2: 99% 98%    Last Pain:  Vitals:   04/09/21 1120  TempSrc:   PainSc: 3                  Norie Latendresse S

## 2021-04-12 ENCOUNTER — Telehealth: Payer: Self-pay | Admitting: Emergency Medicine

## 2021-04-12 NOTE — Telephone Encounter (Signed)
Reviewed cytology with the patient by phone today.  No evidence of malignancy.  There are granulomas present.  Most likely diagnosis will be sarcoidosis.  It does not look like the BAL was sent for culture.  I do not see any other evidence to suggest either mycobacterial or fungal infection but it may be relevant for [place] to check sputum at some point.  We will plan to review in full at her follow-up office visit.

## 2021-04-26 ENCOUNTER — Encounter: Payer: Self-pay | Admitting: Emergency Medicine

## 2021-04-26 ENCOUNTER — Ambulatory Visit (INDEPENDENT_AMBULATORY_CARE_PROVIDER_SITE_OTHER): Payer: BC Managed Care – PPO | Admitting: Emergency Medicine

## 2021-04-26 ENCOUNTER — Other Ambulatory Visit: Payer: Self-pay

## 2021-04-26 DIAGNOSIS — D869 Sarcoidosis, unspecified: Secondary | ICD-10-CM

## 2021-04-26 NOTE — Progress Notes (Signed)
Subjective:    Patient ID: Kari Rodriguez, female    DOB: 09-14-1991, 30 y.o.   MRN: [id number]  HPI  ROV 02/12/21 --follow-up visit with 30 year old woman with history of tension headaches whom I have seen for chronic cough with an upper airway component.  She has some left midlung linear opacity, suspect to be atelectasis.  Pulmonary function testing 02/2020 showed normal spirometry without a bronchodilator response, normal diffusion capacity.  She is being treated for chronic rhinitis, GERD. She has cough in the am, produces some gray mucous plugs.   She was seen in the emergency department 01/27/2021 for chest discomfort and had a CT scan of her chest performed which I have reviewed.  This shows no evidence of pulmonary embolism, no mediastinal adenopathy, a right lower lobe soft tissue lesion 2.2 cm in largest dimension and stable lingular subsegmental atelectasis looks like there may be some right hilar fullness although no discrete lymphadenopathy was identified in the report  ROV 03/23/21 --30 year old woman whom I follow for chronic cough in the setting of GERD, chronic rhinitis.  She is a chest x-ray in the past that showed left midlung atelectasis.  A 2 cm rounded right lower lobe soft tissue density was noted on CT chest 01/27/2021, unclear etiology.  Prompted PET scan as below.  PET scan performed 02/28/2021 reviewed by me, shows that the 2.0 cm lobulated medial right lower lobe nodule is stable in size and is not hypermetabolic.  There is no hypermetabolic hilar or mediastinal lymphadenopathy.  There is a subcentimeter left axillary lymph node that has mild uptake (SUV 1.8), small external iliac and bilateral inguinal nodes with some moderate hypermetabolism also noted, question inflammatory.  ROV 04/26/21 --follow-up visit 30 year old woman with history of chronic cough, GERD, chronic rhinitis and a 2 cm rounded right lower lobe soft tissue density on CT chest, not hypermetabolic on PET  scan.  She underwent bronchoscopy 04/09/2021.  Cytology without any evidence of malignancy but there were granulomas present, most consistent with sarcoidosis.  She is still dealing with sinus drainage, no cough.    Review of Systems As per HPI     Objective:   Physical Exam Vitals:   04/26/21 0911  BP: 112/76  Pulse: 72  Temp: 98.3 F (36.8 C)  SpO2: 100%  Weight: 299 lb (135.6 kg)  Height: 5\' 10"  (1.778 m)   Gen: Pleasant, obese woman, in no distress,  normal affect  ENT: No lesions,  mouth clear,  oropharynx clear, no postnasal drip  Neck: No JVD, no stridor  Lungs: No use of accessory muscles, distant, no crackles or wheezing on normal respiration, no wheeze on forced expiration  Cardiovascular: RRR, heart sounds normal, no murmur or gallops, no peripheral edema  Musculoskeletal: No deformities, no cyanosis or clubbing  Neuro: alert, awake, non focal  Skin: Warm, no lesions or rash       Assessment & Plan:  Sarcoidosis Your bronchoscopy results are consistent with sarcoidosis, and inflammatory disease that can affect the lung, lymph nodes, skin and other tissues.  There is no evidence of active inflammation at this time, and I do not believe you need to be on anti-inflammatory medicine for now. We will plan to repeat your CT scan of the chest in May/2023 without contrast to follow sarcoidosis You need to see an ophthalmologist annually to have your eyes examined. Please contact our office if you have any changes in your breathing, coughing or any new respiratory symptoms.  If so  we may do your testing early. Follow Dr. Delton Coombes in May 2023 after your CT scan so that we can review together.  Levy Pupa, MD, PhD 04/26/2021, 9:44 AM [facility] [phone] or if no answer [phone]

## 2021-04-26 NOTE — Addendum Note (Signed)
Addended by: Benjie Karvonen R on: 04/26/2021 09:50 AM   Modules accepted: Orders

## 2021-04-26 NOTE — Assessment & Plan Note (Signed)
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  There is no evidence of active inflammation at this time, and I do not believe you need to be on anti-inflammatory medicine for now. We will plan to repeat your CT scan of the chest in May/2023 without contrast to follow sarcoidosis You need to see an ophthalmologist annually to have your eyes examined. Please contact our office if you have any changes in your breathing, coughing or any new respiratory symptoms.  If so we may do your testing early. Follow Dr. Delton Coombes in May 2023 after your CT scan so that we can review together.

## 2021-04-26 NOTE — Patient Instructions (Addendum)
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  There is no evidence of active inflammation at this time, and I do not believe you need to be on anti-inflammatory medicine for now. We will plan to repeat your CT scan of the chest in May/2023 without contrast to follow sarcoidosis You need to see an ophthalmologist annually to have your eyes examined. Please contact our office if you have any changes in your breathing, coughing or any new respiratory symptoms.  If so we may do your testing early. Try using loratadine or fluticasone nasal spray for increased nasal congestion  Follow Dr. Delton Coombes in May 2023 after your CT scan so that we can review together.

## 2021-05-01 ENCOUNTER — Other Ambulatory Visit: Payer: Self-pay | Admitting: Neurology

## 2021-06-07 DIAGNOSIS — U071 COVID-19: Secondary | ICD-10-CM | POA: Diagnosis not present

## 2021-06-26 DIAGNOSIS — R3989 Other symptoms and signs involving the genitourinary system: Secondary | ICD-10-CM | POA: Diagnosis not present

## 2021-09-19 IMAGING — CT CT ANGIO CHEST
3 of 9 series · 18 of 36 positions shown · IV contrast (Omnipaque)
Comparison: None.

CLINICAL DATA: 30-year-old female with chest pain.

EXAM:
CT ANGIOGRAPHY CHEST WITH CONTRAST
TECHNIQUE: Multidetector CT imaging of the chest was performed using the
standard protocol during bolus administration of intravenous
contrast. Multiplanar CT image reconstructions and MIPs were
obtained to evaluate the vascular anatomy.
CONTRAST:  100 mL Omnipaque 350, intravenous

[Series 5: pe thins · axial · 0.84mm/px · z∈[-306,-70]mm · 14 of 275 slices shown]
[im 19/275  lung]
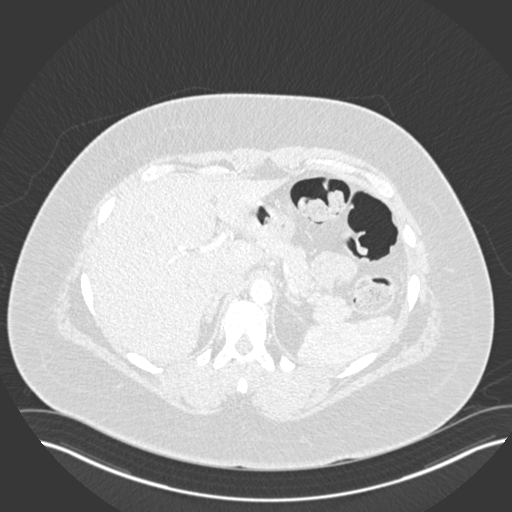
[im 37/275  mediastinal]
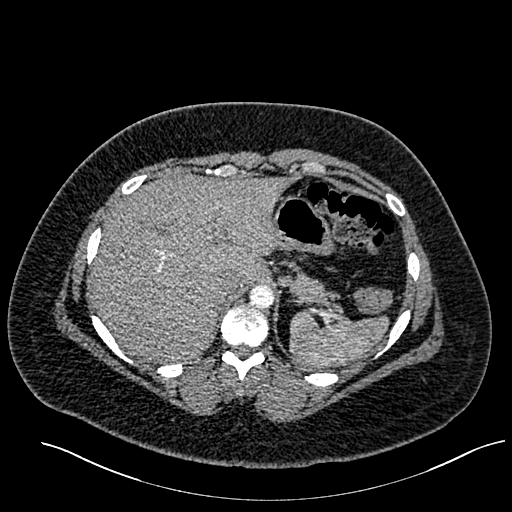
[im 55/275  lung]
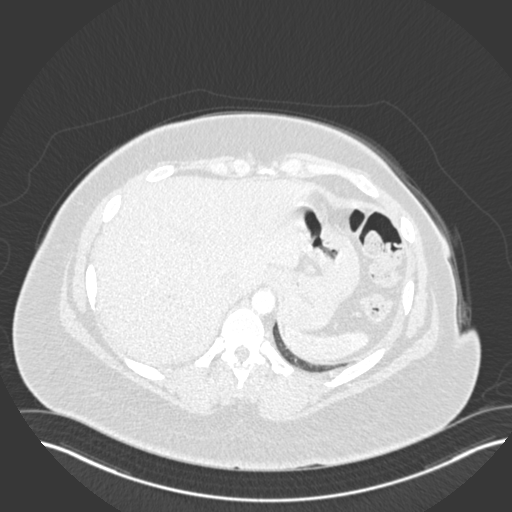
[im 74/275  mediastinal]
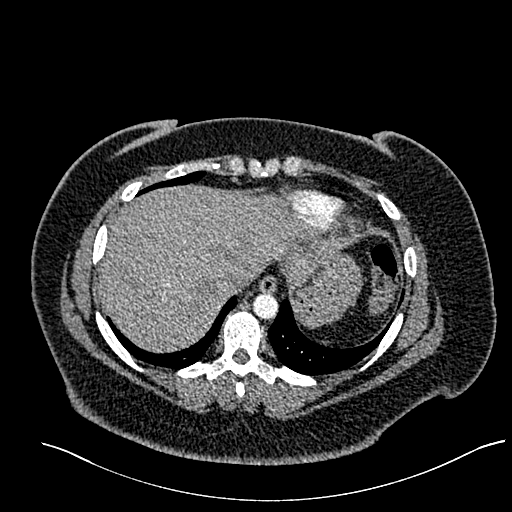
[im 92/275  lung]
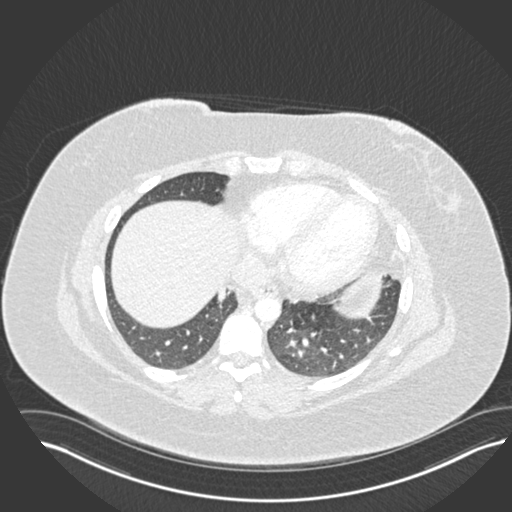
[im 110/275  mediastinal]
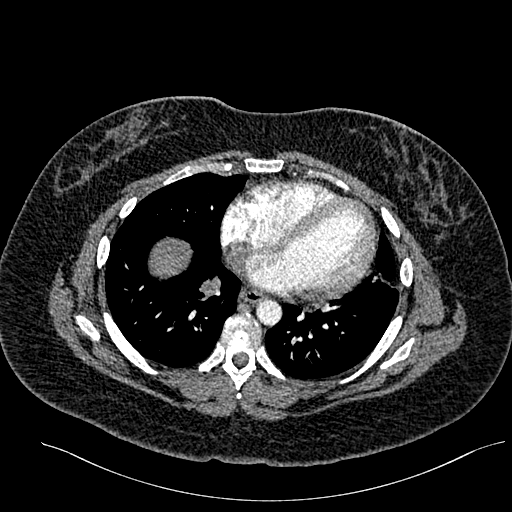
[im 128/275  lung]
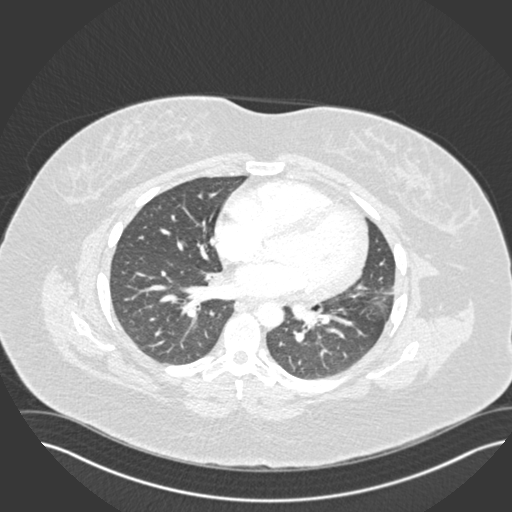
[im 147/275  mediastinal]
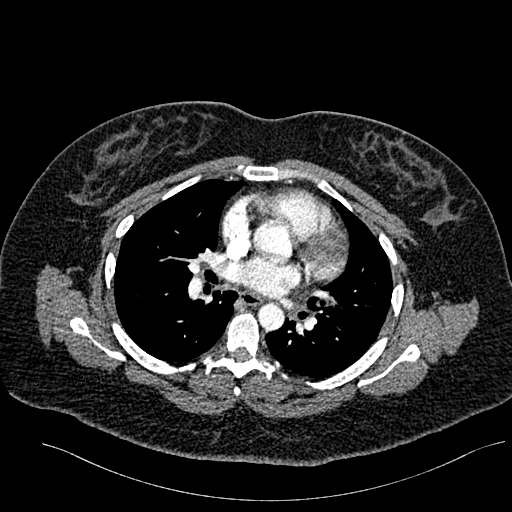
[im 165/275  lung]
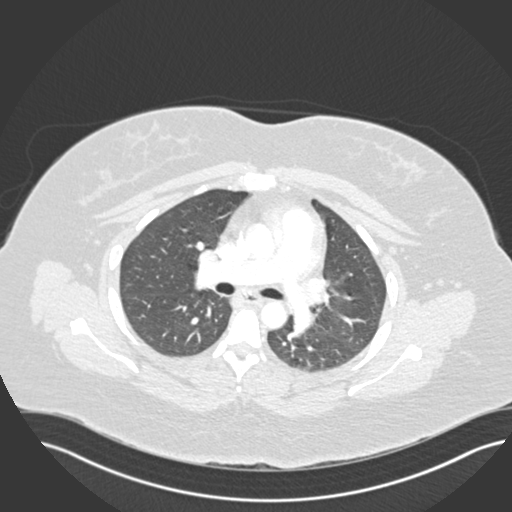
[im 183/275  mediastinal]
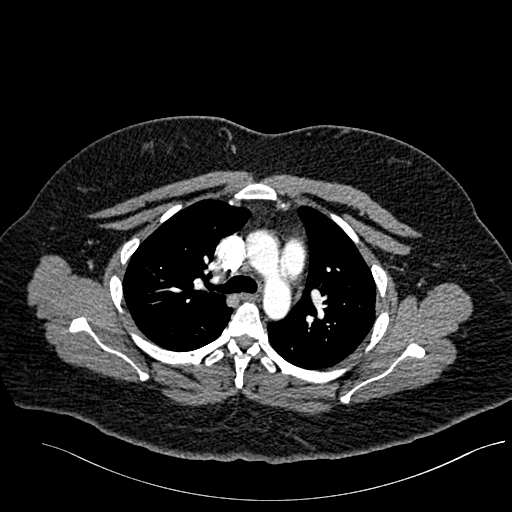
[im 201/275  lung]
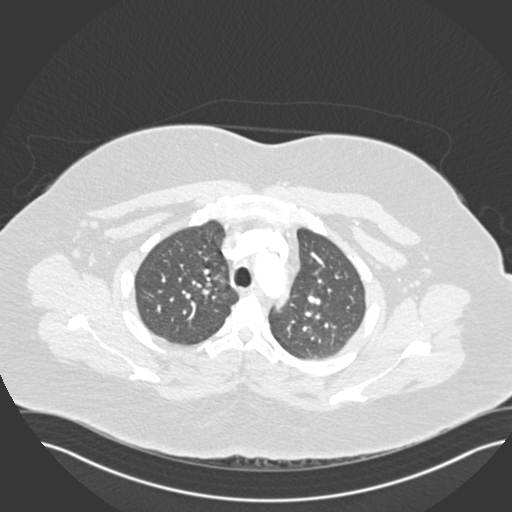
[im 220/275  mediastinal]
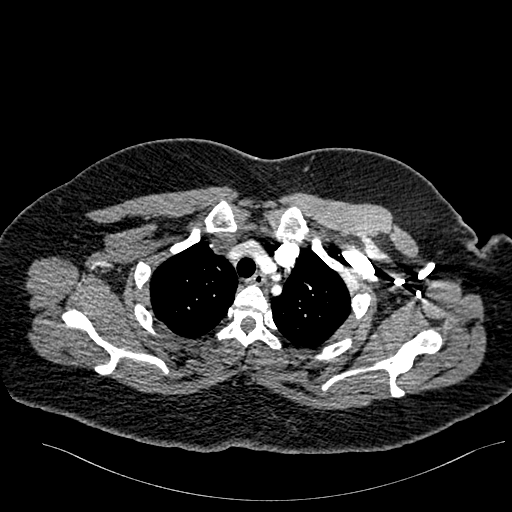
[im 238/275  lung]
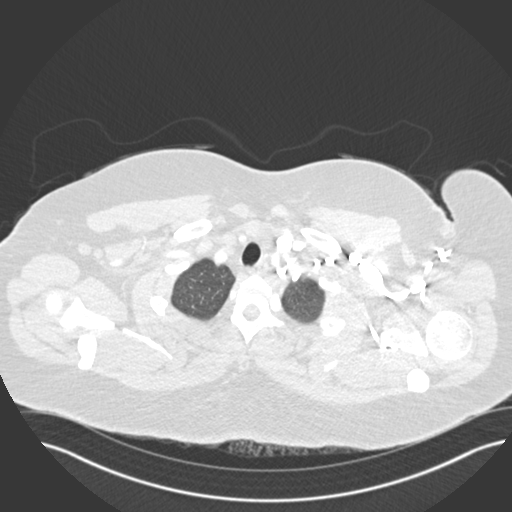
[im 256/275  mediastinal]
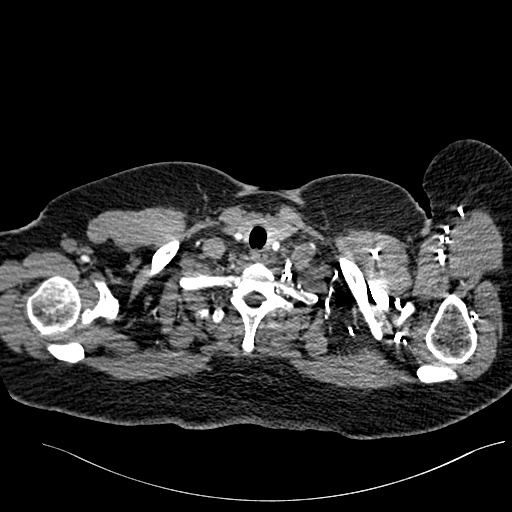

[Series 6: pe lung · axial · 0.98mm/px · z∈[-224,-110]mm · 3 of 78 slices shown]
[im 20/78  mediastinal]
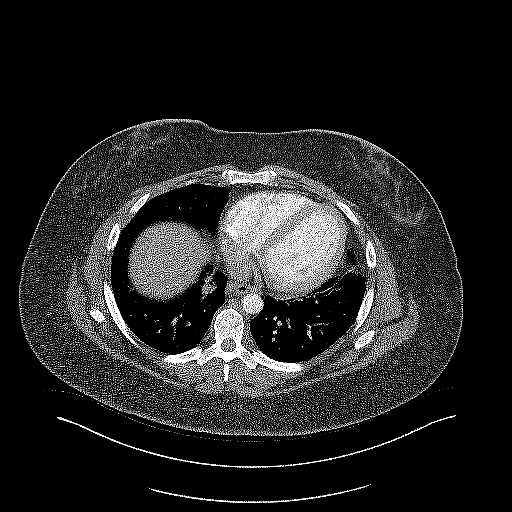
[im 39/78  mediastinal]
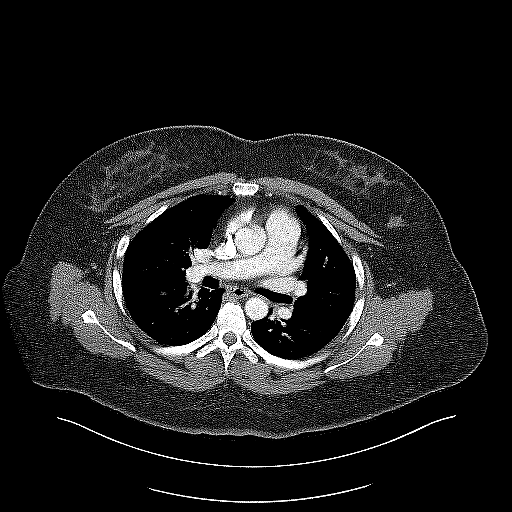
[im 58/78  mediastinal]
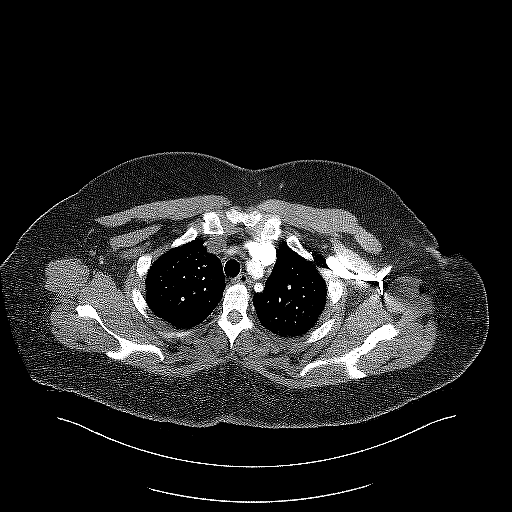

[Series 7: pe coronal mpr · coronal · 0.56mm/px · 1 of 157 slices shown]
[im 79/157  mediastinal]
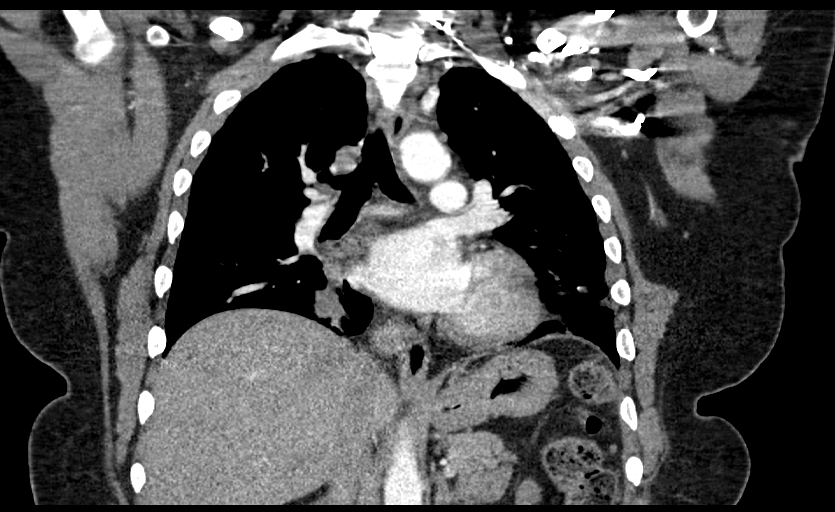

[18 of 36 positions shown; findings below may reference images not displayed]

FINDINGS: Cardiovascular: Satisfactory opacification of the pulmonary arteries
to the segmental level. No evidence of pulmonary embolism. Normal
heart size. No pericardial effusion.

Mediastinum/Nodes: No enlarged mediastinal, hilar, or axillary lymph
nodes. Thyroid gland, trachea, and esophagus demonstrate no
significant findings.

Lungs/Pleura: In the medial aspect of the right lower lobe is a
peribronchovascular soft tissue mass with lobular borders measuring
approximately 2.0 x 2.2 x 1.8 cm. Mild lingular subsegmental
atelectasis. No pleural effusion or pneumothorax.

Upper Abdomen: The visualized upper abdomen is within normal limits.

Musculoskeletal: No chest wall abnormality. No acute or significant
osseous findings.

Review of the MIP images confirms the above findings.
IMPRESSION: Vascular:

No evidence of pulmonary embolism.

Non-Vascular:

Indeterminate peribronchovascular mass in the medial aspect of the
right lower lobe measuring up to 2.2 cm. No thoracic
lymphadenopathy.

Recommend PET-CT on a nonemergent basis for further evaluation.

## 2021-12-14 DIAGNOSIS — Z01419 Encounter for gynecological examination (general) (routine) without abnormal findings: Secondary | ICD-10-CM | POA: Diagnosis not present

## 2021-12-14 DIAGNOSIS — Z113 Encounter for screening for infections with a predominantly sexual mode of transmission: Secondary | ICD-10-CM | POA: Diagnosis not present

## 2022-03-12 DIAGNOSIS — L810 Postinflammatory hyperpigmentation: Secondary | ICD-10-CM | POA: Diagnosis not present

## 2022-03-12 DIAGNOSIS — L708 Other acne: Secondary | ICD-10-CM | POA: Diagnosis not present

## 2022-04-11 ENCOUNTER — Other Ambulatory Visit: Payer: BC Managed Care – PPO

## 2022-11-29 ENCOUNTER — Emergency Department (HOSPITAL_COMMUNITY): Payer: PRIVATE HEALTH INSURANCE

## 2022-11-29 ENCOUNTER — Other Ambulatory Visit: Payer: Self-pay

## 2022-11-29 ENCOUNTER — Emergency Department (HOSPITAL_COMMUNITY)
Admission: EM | Admit: 2022-11-29 | Discharge: 2022-11-30 | Disposition: A | Discharge status: Home / Self Care | Payer: PRIVATE HEALTH INSURANCE | Attending: Emergency Medicine | Admitting: Emergency Medicine

## 2022-11-29 DIAGNOSIS — R0781 Pleurodynia: Secondary | ICD-10-CM | POA: Insufficient documentation

## 2022-11-29 DIAGNOSIS — R000 Tachycardia, unspecified: Secondary | ICD-10-CM | POA: Insufficient documentation

## 2022-11-29 DIAGNOSIS — Z1152 Encounter for screening for COVID-19: Secondary | ICD-10-CM | POA: Insufficient documentation

## 2022-11-29 DIAGNOSIS — D72829 Elevated white blood cell count, unspecified: Secondary | ICD-10-CM | POA: Insufficient documentation

## 2022-11-29 DIAGNOSIS — M25511 Pain in right shoulder: Secondary | ICD-10-CM | POA: Insufficient documentation

## 2022-11-29 LAB — CBC WITH DIFFERENTIAL/PLATELET
Abs Immature Granulocytes: 0.08 10*3/uL — ABNORMAL HIGH (ref 0.00–0.07)
Basophils Absolute: 0.1 10*3/uL (ref 0.0–0.1)
Basophils Relative: 1 %
Eosinophils Absolute: 0.4 10*3/uL (ref 0.0–0.5)
Eosinophils Relative: 3 %
HCT: 36 % (ref 36.0–46.0)
Hemoglobin: 11.7 g/dL — ABNORMAL LOW (ref 12.0–15.0)
Immature Granulocytes: 1 %
Lymphocytes Relative: 24 %
Lymphs Abs: 3.6 10*3/uL (ref 0.7–4.0)
MCH: 27.4 pg (ref 26.0–34.0)
MCHC: 32.5 g/dL (ref 30.0–36.0)
MCV: 84.3 fL (ref 80.0–100.0)
Monocytes Absolute: 1.2 10*3/uL — ABNORMAL HIGH (ref 0.1–1.0)
Monocytes Relative: 8 %
Neutro Abs: 9.8 10*3/uL — ABNORMAL HIGH (ref 1.7–7.7)
Neutrophils Relative %: 63 %
Platelets: 429 10*3/uL — ABNORMAL HIGH (ref 150–400)
RBC: 4.27 MIL/uL (ref 3.87–5.11)
RDW: 13.2 % (ref 11.5–15.5)
WBC: 15.1 10*3/uL — ABNORMAL HIGH (ref 4.0–10.5)
nRBC: 0 % (ref 0.0–0.2)

## 2022-11-29 LAB — BASIC METABOLIC PANEL
Anion gap: 10 (ref 5–15)
BUN: 5 mg/dL — ABNORMAL LOW (ref 6–20)
CO2: 23 mmol/L (ref 22–32)
Calcium: 8.8 mg/dL — ABNORMAL LOW (ref 8.9–10.3)
Chloride: 104 mmol/L (ref 98–111)
Creatinine, Ser: 0.9 mg/dL (ref 0.44–1.00)
GFR, Estimated: >60 mL/min (ref >60)
Glucose, Bld: 90 mg/dL (ref 70–99)
Potassium: 3.6 mmol/L (ref 3.5–5.1)
Sodium: 137 mmol/L (ref 135–145)

## 2022-11-29 LAB — TROPONIN I (HIGH SENSITIVITY)
Troponin I (High Sensitivity): 4 ng/L (ref <18)
Troponin I (High Sensitivity): 4 ng/L (ref <18)

## 2022-11-29 LAB — RESP PANEL BY RT-PCR (RSV, FLU A&B, COVID)  RVPGX2
Influenza A by PCR: NEGATIVE
Influenza B by PCR: NEGATIVE
Resp Syncytial Virus by PCR: NEGATIVE
SARS Coronavirus 2 by RT PCR: NEGATIVE

## 2022-11-29 NOTE — ED Triage Notes (Signed)
Patient reports productive cough with chills this week and right side shoulder pain this evening with chest tightness.

## 2022-11-29 NOTE — ED Provider Triage Note (Signed)
Emergency Medicine Provider Triage Evaluation Note  ROSELIE CIRIGLIANO , a 32 y.o. female  was evaluated in triage.  Pt complains of cough, chest pain, right shoulder pain, fever/chills.  Patient reports symptoms beginning 9 days ago when one of her family members was sick with similar symptoms.  She has persistence of cough.  She states that today when she went to lay down after work noted anterior chest pain as well as right shoulder pain worsened with cough and taking a deep breath.  Today is also 1 chills/fever began.  Denies history of cardiac or pulmonary issues.  Denies history of DVT/PE, recent surgery/immobilization, known malignancy  Review of Systems  Positive: See above Negative:   Physical Exam  BP (!) 147/83 (BP Location: Left Arm)   Pulse (!) 119   Temp 99.6 F (37.6 C) (Oral)   Resp 20   SpO2 99%  Gen:   Awake, no distress   Resp:  Normal effort  MSK:   Moves extremities without difficulty  Other:    Medical Decision Making  Medically screening exam initiated at 8:07 PM.  Appropriate orders placed.  Tiauna Whisnant Austill was informed that the remainder of the evaluation will be completed by another provider, this initial triage assessment does not replace that evaluation, and the importance of remaining in the ED until their evaluation is complete.     Wilnette Kales, [place] 11/29/22 2008

## 2022-11-30 ENCOUNTER — Emergency Department (HOSPITAL_COMMUNITY): Payer: PRIVATE HEALTH INSURANCE

## 2022-11-30 LAB — I-STAT BETA HCG BLOOD, ED (MC, WL, AP ONLY): I-stat hCG, quantitative: <5 m[IU]/mL (ref <5)

## 2022-11-30 MED ORDER — LACTATED RINGERS IV BOLUS
1000.0000 mL | Freq: Once | INTRAVENOUS | Status: AC
  Administered 2022-11-30: 1000 mL via INTRAVENOUS

## 2022-11-30 MED ORDER — PREDNISONE 20 MG PO TABS: 40.0000 mg | ORAL_TABLET | Freq: Every day | ORAL | 0 refills | Status: AC

## 2022-11-30 MED ORDER — MORPHINE SULFATE (PF) 2 MG/ML IV SOLN
2.0000 mg | Freq: Once | INTRAVENOUS | Status: AC
  Administered 2022-11-30: 2 mg via INTRAVENOUS
  Filled 2022-11-30: qty 1

## 2022-11-30 MED ORDER — PREDNISONE 20 MG PO TABS
60.0000 mg | ORAL_TABLET | Freq: Once | ORAL | Status: AC
  Administered 2022-11-30: 60 mg via ORAL
  Filled 2022-11-30: qty 3

## 2022-11-30 MED ORDER — IOHEXOL 350 MG/ML SOLN
75.0000 mL | Freq: Once | INTRAVENOUS | Status: AC | PRN
  Administered 2022-11-30: 75 mL via INTRAVENOUS
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
## 2022-11-30 NOTE — ED Notes (Signed)
The iv that was infusing was taken off by the c-t tech   no question about it to the nursing staff

## 2022-11-30 NOTE — ED Notes (Signed)
To ct

## 2022-11-30 NOTE — Discharge Instructions (Addendum)
You were evaluated in the Emergency Department and after careful evaluation, we did not find any emergent condition requiring admission or further testing in the hospital.  Your exam/testing today is overall reassuring.  Suspect your pain is related to some continued inflammation in the lungs from your recent illness.  Recommend taking the prednisone medication daily to see if this will clear the inflammation.  We are giving you your first dose this morning, start the prescription for prednisone tomorrow.  We discussed your pulmonary nodule and how it is a bit bigger.  Follow-up with Dr. Malvin Johns to discuss future management.  Please return to the Emergency Department if you experience any worsening of your condition.   Thank you for allowing [place] to be a part of your care.

## 2022-12-11 ENCOUNTER — Encounter: Payer: Self-pay | Admitting: Emergency Medicine

## 2022-12-11 ENCOUNTER — Ambulatory Visit: Payer: Self-pay | Admitting: Emergency Medicine

## 2022-12-11 VITALS — BP 104/72 | HR 94 | Temp 98.1°F | Ht 71.0 in | Wt 308.6 lb

## 2022-12-11 DIAGNOSIS — Z1152 Encounter for screening for COVID-19: Secondary | ICD-10-CM

## 2022-12-11 DIAGNOSIS — R918 Other nonspecific abnormal finding of lung field: Secondary | ICD-10-CM

## 2022-12-11 DIAGNOSIS — Z01812 Encounter for preprocedural laboratory examination: Secondary | ICD-10-CM

## 2022-12-11 NOTE — Patient Instructions (Addendum)
We will plan a repeat navigational bronchoscopy.  Will try to get this done on 12/23/2022.  This will be done under general anesthesia as an outpatient at [facility] endoscopy.  You will need a designated driver. Follow Dr. Lamonte Sakai in 1 month or next available

## 2022-12-11 NOTE — Progress Notes (Signed)
   Subjective:    Patient ID: Kari Rodriguez, female    DOB: 1991/09/28, 32 y.o.   MRN: [id number]  HPI  ROV 04/26/21 --follow-up visit 32 year old woman with history of chronic cough, GERD, chronic rhinitis and a 2 cm rounded right lower lobe soft tissue density on CT chest, not hypermetabolic on PET scan.  She underwent bronchoscopy 04/09/2021.  Cytology without any evidence of malignancy but there were granulomas present, most consistent with sarcoidosis.  She is still dealing with sinus drainage, no cough.    ROV 12/11/22 --follow-up visit for 32 year old woman with a history of obesity, chronic cough, GERD, chronic rhinitis.  I performed bronchoscopy 03/2021 for a rounded right lower lobe soft tissue density that was negative on PET scan.  The biopsies were consistent with granulomatous inflammation, suggestive of sarcoidosis.  She was seen in the emergency department 11/29/2022. She had a URI, then developed chills, R sided shoulder and neck pain. Was seen in the ED. COVID, RSV, flu negative. Took pred x 4 days and the shoulder pain improved., still with a lingering cough. She has also seen a raised red rash on her L shin, now resolving and dark .  She underwent a CT-PA as below.  CT-PA 11/29/2022 reviewed by me shows no mediastinal or hilar adenopathy.  Bilateral bronchial wall thickening.  Right lower lobe soft tissue density 3.3 x 2.9 cm, larger than her prior from 03/2021.   Review of Systems As per HPI     Objective:   Physical Exam Vitals:   12/11/22 1600  BP: 104/72  Pulse: 94  Temp: 98.1 F (36.7 C)  TempSrc: Oral  SpO2: 99%  Weight: (!) 308 lb 9.6 oz (140 kg)  Height: 5\' 11"  (1.803 m)    Gen: Pleasant, obese woman, in no distress,  normal affect  ENT: No lesions,  mouth clear,  oropharynx clear, no postnasal drip  Neck: No JVD, no stridor  Lungs: No use of accessory muscles, distant, no crackles or wheezing on normal respiration, no wheeze on forced  expiration  Cardiovascular: RRR, heart sounds normal, no murmur or gallops, no peripheral edema  Musculoskeletal: No deformities, no cyanosis or clubbing  Neuro: alert, awake, non focal  Skin: Warm, no lesions or rash      Assessment & Plan:  Right lower lobe lung mass Enlarged right lower lobe opacity on most recent CT chest.  She does carry history of presumed sarcoidosis based on granulomas seen when I did her original right lower lobe biopsies.  Unfortunately I do not have any culture data from that bronchoscopy.  Must question whether there could be an opportunistic infectious process with associated granulomatous inflammation.  For that reason I do not want to treat her with empiric steroids.  Discussed the pros and cons with her and we have decided to repeat a navigational bronchoscopy to confirm the tissue diagnosis and to get good culture data.  We will try to get this scheduled for 12/23/2022.  Time spent 38 minutes  Baltazar Apo, MD, PhD 12/11/2022, 4:27 PM  Pulmonary and Critical Care [phone] or if no answer [phone]

## 2022-12-11 NOTE — Addendum Note (Signed)
Addended by: Gavin Potters R on: 12/11/2022 04:37 PM   Modules accepted: Orders

## 2022-12-11 NOTE — H&P (View-Only) (Signed)
   Subjective:    Patient ID: Kari Rodriguez, female    DOB: 08/17/1991, 31 y.o.   MRN: [id number]  HPI  ROV 04/26/21 --follow-up visit 30-year-old woman with history of chronic cough, GERD, chronic rhinitis and a 2 cm rounded right lower lobe soft tissue density on CT chest, not hypermetabolic on PET scan.  She underwent bronchoscopy 04/09/2021.  Cytology without any evidence of malignancy but there were granulomas present, most consistent with sarcoidosis.  She is still dealing with sinus drainage, no cough.    ROV 12/11/22 --follow-up visit for 31-year-old woman with a history of obesity, chronic cough, GERD, chronic rhinitis.  I performed bronchoscopy 03/2021 for a rounded right lower lobe soft tissue density that was negative on PET scan.  The biopsies were consistent with granulomatous inflammation, suggestive of sarcoidosis.  She was seen in the emergency department 11/29/2022. She had a URI, then developed chills, R sided shoulder and neck pain. Was seen in the ED. COVID, RSV, flu negative. Took pred x 4 days and the shoulder pain improved., still with a lingering cough. She has also seen a raised red rash on her L shin, now resolving and dark .  She underwent a CT-PA as below.  CT-PA 11/29/2022 reviewed by me shows no mediastinal or hilar adenopathy.  Bilateral bronchial wall thickening.  Right lower lobe soft tissue density 3.3 x 2.9 cm, larger than her prior from 03/2021.   Review of Systems As per HPI     Objective:   Physical Exam Vitals:   12/11/22 1600  BP: 104/72  Pulse: 94  Temp: 98.1 F (36.7 C)  TempSrc: Oral  SpO2: 99%  Weight: (!) 308 lb 9.6 oz (140 kg)  Height: 5' 11" (1.803 m)    Gen: Pleasant, obese woman, in no distress,  normal affect  ENT: No lesions,  mouth clear,  oropharynx clear, no postnasal drip  Neck: No JVD, no stridor  Lungs: No use of accessory muscles, distant, no crackles or wheezing on normal respiration, no wheeze on forced  expiration  Cardiovascular: RRR, heart sounds normal, no murmur or gallops, no peripheral edema  Musculoskeletal: No deformities, no cyanosis or clubbing  Neuro: alert, awake, non focal  Skin: Warm, no lesions or rash      Assessment & Plan:  Right lower lobe lung mass Enlarged right lower lobe opacity on most recent CT chest.  She does carry history of presumed sarcoidosis based on granulomas seen when I did her original right lower lobe biopsies.  Unfortunately I do not have any culture data from that bronchoscopy.  Must question whether there could be an opportunistic infectious process with associated granulomatous inflammation.  For that reason I do not want to treat her with empiric steroids.  Discussed the pros and cons with her and we have decided to repeat a navigational bronchoscopy to confirm the tissue diagnosis and to get good culture data.  We will try to get this scheduled for 12/23/2022.  Time spent 38 minutes  Alaija Ruble, MD, PhD 12/11/2022, 4:27 PM [facility] [phone] or if no answer [phone]  

## 2022-12-11 NOTE — Assessment & Plan Note (Signed)
Enlarged right lower lobe opacity on most recent CT chest.  She does carry history of presumed sarcoidosis based on granulomas seen when I did her original right lower lobe biopsies.  Unfortunately I do not have any culture data from that bronchoscopy.  Must question whether there could be an opportunistic infectious process with associated granulomatous inflammation.  For that reason I do not want to treat her with empiric steroids.  Discussed the pros and cons with her and we have decided to repeat a navigational bronchoscopy to confirm the tissue diagnosis and to get good culture data.  We will try to get this scheduled for 12/23/2022.

## 2022-12-19 ENCOUNTER — Other Ambulatory Visit: Payer: Self-pay

## 2022-12-19 ENCOUNTER — Other Ambulatory Visit: Payer: PRIVATE HEALTH INSURANCE

## 2022-12-19 ENCOUNTER — Encounter (HOSPITAL_COMMUNITY): Payer: Self-pay | Admitting: Emergency Medicine

## 2022-12-19 DIAGNOSIS — Z01812 Encounter for preprocedural laboratory examination: Secondary | ICD-10-CM

## 2022-12-19 NOTE — Progress Notes (Signed)
Spoke with pt for pre-op call. Pt denies cardiac history, HTN or Diabetes.   Covid test done today.   Shower instructions given to pt and she voiced understanding.

## 2022-12-21 LAB — NOVEL CORONAVIRUS, NAA: SARS-CoV-2, NAA: NOT DETECTED

## 2022-12-21 LAB — SPECIMEN STATUS REPORT

## 2022-12-22 NOTE — Anesthesia Preprocedure Evaluation (Signed)
Anesthesia Evaluation  Patient identified by MRN, date of birth, ID band Patient awake    Reviewed: Allergy & Precautions, NPO status , Patient's Chart, lab work & pertinent test results  Airway Mallampati: II  TM Distance: >3 FB Neck ROM: Full    Dental no notable dental hx. (+) Dental Advisory Given   Pulmonary neg pulmonary ROS   Pulmonary exam normal        Cardiovascular negative cardio ROS Normal cardiovascular exam     Neuro/Psych negative neurological ROS     GI/Hepatic negative GI ROS, Neg liver ROS,,,  Endo/Other    Morbid obesity  Renal/GU negative Renal ROS     Musculoskeletal negative musculoskeletal ROS (+)    Abdominal   Peds  Hematology negative hematology ROS (+)   Anesthesia Other Findings   Reproductive/Obstetrics                             Anesthesia Physical Anesthesia Plan  ASA: 3  Anesthesia Plan: General   Post-op Pain Management: Minimal or no pain anticipated   Induction:   PONV Risk Score and Plan: 3 and Ondansetron, Propofol infusion, TIVA and Midazolam  Airway Management Planned: Oral ETT  Additional Equipment:   Intra-op Plan:   Post-operative Plan: Extubation in OR  Informed Consent: I have reviewed the patients History and Physical, chart, labs and discussed the procedure including the risks, benefits and alternatives for the proposed anesthesia with the patient or authorized representative who has indicated his/her understanding and acceptance.     Dental advisory given  Plan Discussed with: Anesthesiologist and CRNA  Anesthesia Plan Comments:         Anesthesia Quick Evaluation

## 2022-12-23 ENCOUNTER — Encounter (HOSPITAL_COMMUNITY): Payer: Self-pay | Admitting: Emergency Medicine

## 2022-12-23 ENCOUNTER — Ambulatory Visit (HOSPITAL_BASED_OUTPATIENT_CLINIC_OR_DEPARTMENT_OTHER): Payer: 59 | Admitting: Anesthesiology

## 2022-12-23 ENCOUNTER — Ambulatory Visit (HOSPITAL_COMMUNITY): Payer: 59

## 2022-12-23 ENCOUNTER — Other Ambulatory Visit: Payer: Self-pay

## 2022-12-23 ENCOUNTER — Encounter (HOSPITAL_COMMUNITY)
Admission: RE | Disposition: A | Discharge status: Home / Self Care | Payer: Self-pay | Source: Ambulatory Visit | Attending: Emergency Medicine

## 2022-12-23 ENCOUNTER — Ambulatory Visit (HOSPITAL_COMMUNITY)
Admission: RE | Admit: 2022-12-23 | Discharge: 2022-12-23 | Disposition: A | Discharge status: Home / Self Care | Payer: 59 | Source: Ambulatory Visit | Attending: Emergency Medicine | Admitting: Emergency Medicine

## 2022-12-23 ENCOUNTER — Ambulatory Visit (HOSPITAL_COMMUNITY): Payer: 59 | Admitting: Anesthesiology

## 2022-12-23 DIAGNOSIS — Z6841 Body Mass Index (BMI) 40.0 and over, adult: Secondary | ICD-10-CM | POA: Diagnosis not present

## 2022-12-23 DIAGNOSIS — R918 Other nonspecific abnormal finding of lung field: Secondary | ICD-10-CM | POA: Insufficient documentation

## 2022-12-23 DIAGNOSIS — K219 Gastro-esophageal reflux disease without esophagitis: Secondary | ICD-10-CM | POA: Insufficient documentation

## 2022-12-23 DIAGNOSIS — J310 Chronic rhinitis: Secondary | ICD-10-CM | POA: Diagnosis not present

## 2022-12-23 HISTORY — PX: BRONCHIAL BIOPSY: SHX5109

## 2022-12-23 HISTORY — PX: VIDEO BRONCHOSCOPY WITH RADIAL ENDOBRONCHIAL ULTRASOUND: SHX6849

## 2022-12-23 HISTORY — DX: Unspecified osteoarthritis, unspecified site: M19.90

## 2022-12-23 HISTORY — DX: Pneumonia, unspecified organism: J18.9

## 2022-12-23 HISTORY — PX: BRONCHIAL WASHINGS: SHX5105

## 2022-12-23 HISTORY — PX: BRONCHIAL NEEDLE ASPIRATION BIOPSY: SHX5106

## 2022-12-23 HISTORY — PX: BRONCHIAL BRUSHINGS: SHX5108

## 2022-12-23 LAB — CBC
HCT: 37.7 % (ref 36.0–46.0)
Hemoglobin: 12.1 g/dL (ref 12.0–15.0)
MCH: 27.6 pg (ref 26.0–34.0)
MCHC: 32.1 g/dL (ref 30.0–36.0)
MCV: 86.1 fL (ref 80.0–100.0)
Platelets: 387 10*3/uL (ref 150–400)
RBC: 4.38 MIL/uL (ref 3.87–5.11)
RDW: 13.4 % (ref 11.5–15.5)
WBC: 9.5 10*3/uL (ref 4.0–10.5)
nRBC: 0 % (ref 0.0–0.2)

## 2022-12-23 LAB — POCT PREGNANCY, URINE: Preg Test, Ur: NEGATIVE

## 2022-12-23 SURGERY — BRONCHOSCOPY, WITH BIOPSY USING ELECTROMAGNETIC NAVIGATION
Anesthesia: General | Laterality: Right

## 2022-12-23 MED ORDER — PROPOFOL 10 MG/ML IV BOLUS
INTRAVENOUS | Status: DC | PRN
  Administered 2022-12-23: 200 mg via INTRAVENOUS

## 2022-12-23 MED ORDER — PROMETHAZINE HCL 25 MG/ML IJ SOLN: 6.2500 mg | INTRAMUSCULAR | Status: DC | PRN

## 2022-12-23 MED ORDER — ROCURONIUM BROMIDE 10 MG/ML (PF) SYRINGE
PREFILLED_SYRINGE | INTRAVENOUS | Status: DC | PRN
  Administered 2022-12-23: 80 mg via INTRAVENOUS

## 2022-12-23 MED ORDER — AMISULPRIDE (ANTIEMETIC) 5 MG/2ML IV SOLN: 10.0000 mg | Freq: Once | INTRAVENOUS | Status: DC | PRN

## 2022-12-23 MED ORDER — PROPOFOL 500 MG/50ML IV EMUL
INTRAVENOUS | Status: DC | PRN
  Administered 2022-12-23: 150 ug/kg/min via INTRAVENOUS

## 2022-12-23 MED ORDER — LACTATED RINGERS IV SOLN: INTRAVENOUS | Status: DC

## 2022-12-23 MED ORDER — FENTANYL CITRATE (PF) 100 MCG/2ML IJ SOLN: 25.0000 ug | INTRAMUSCULAR | Status: DC | PRN

## 2022-12-23 MED ORDER — ONDANSETRON HCL 4 MG/2ML IJ SOLN
INTRAMUSCULAR | Status: DC | PRN
  Administered 2022-12-23: 4 mg via INTRAVENOUS

## 2022-12-23 MED ORDER — SUGAMMADEX SODIUM 200 MG/2ML IV SOLN
INTRAVENOUS | Status: DC | PRN
  Administered 2022-12-23: 300 mg via INTRAVENOUS

## 2022-12-23 MED ORDER — LIDOCAINE 2% (20 MG/ML) 5 ML SYRINGE
INTRAMUSCULAR | Status: DC | PRN
  Administered 2022-12-23: 100 mg via INTRAVENOUS

## 2022-12-23 MED ORDER — CHLORHEXIDINE GLUCONATE 0.12 % MT SOLN
15.0000 mL | Freq: Once | OROMUCOSAL | Status: AC
  Administered 2022-12-23: 15 mL via OROMUCOSAL

## 2022-12-23 MED ORDER — MIDAZOLAM HCL 2 MG/2ML IJ SOLN
INTRAMUSCULAR | Status: DC | PRN
  Administered 2022-12-23: 2 mg via INTRAVENOUS

## 2022-12-23 MED ORDER — ACETAMINOPHEN 500 MG PO TABS
1000.0000 mg | ORAL_TABLET | Freq: Once | ORAL | Status: AC
  Administered 2022-12-23: 1000 mg via ORAL
  Filled 2022-12-23: qty 2

## 2022-12-23 MED ORDER — DEXAMETHASONE SODIUM PHOSPHATE 10 MG/ML IJ SOLN
INTRAMUSCULAR | Status: DC | PRN
  Administered 2022-12-23: 5 mg via INTRAVENOUS

## 2022-12-23 MED ORDER — FENTANYL CITRATE (PF) 250 MCG/5ML IJ SOLN
INTRAMUSCULAR | Status: DC | PRN
  Administered 2022-12-23: 100 ug via INTRAVENOUS

## 2022-12-23 NOTE — Transfer of Care (Signed)
Immediate Anesthesia Transfer of Care Note  Patient: Kari Rodriguez  Procedure(s) Performed: ROBOTIC ASSISTED NAVIGATIONAL BRONCHOSCOPY (Right) VIDEO BRONCHOSCOPY WITH RADIAL ENDOBRONCHIAL ULTRASOUND BRONCHIAL BRUSHINGS BRONCHIAL WASHINGS BRONCHIAL NEEDLE ASPIRATION BIOPSIES BRONCHIAL BIOPSIES  Patient Location: PACU  Anesthesia Type:General  Level of Consciousness: awake and alert   Airway & Oxygen Therapy: Patient Spontanous Breathing  Post-op Assessment: Report given to RN and Post -op Vital signs reviewed and stable  Post vital signs: Reviewed and stable  Last Vitals:  Vitals Value Taken Time  BP 96/62   Temp    Pulse 85 12/23/22 0851  Resp 20 12/23/22 0851  SpO2 99 % 12/23/22 0851  Vitals shown include unvalidated device data.  Last Pain:  Vitals:   12/23/22 0658  TempSrc:   PainSc: 0-No pain         Complications: No notable events documented.

## 2022-12-23 NOTE — Anesthesia Postprocedure Evaluation (Signed)
Anesthesia Post Note  Patient: Kari Rodriguez  Procedure(s) Performed: ROBOTIC ASSISTED NAVIGATIONAL BRONCHOSCOPY (Right) VIDEO BRONCHOSCOPY WITH RADIAL ENDOBRONCHIAL ULTRASOUND BRONCHIAL BRUSHINGS BRONCHIAL WASHINGS BRONCHIAL NEEDLE ASPIRATION BIOPSIES BRONCHIAL BIOPSIES     Patient location during evaluation: PACU Anesthesia Type: General Level of consciousness: sedated Pain management: pain level controlled Vital Signs Assessment: post-procedure vital signs reviewed and stable Respiratory status: spontaneous breathing and respiratory function stable Cardiovascular status: stable Postop Assessment: no apparent nausea or vomiting Anesthetic complications: no   No notable events documented.  Last Vitals:  Vitals:   12/23/22 0915 12/23/22 0930  BP: (!) 89/61 103/71  Pulse: 69 67  Resp: 12 16  Temp:  37.1 C  SpO2: 98% 100%    Last Pain:  Vitals:   12/23/22 0930  TempSrc:   PainSc: 0-No pain                 Teghan Philbin DANIEL

## 2022-12-23 NOTE — Op Note (Signed)
Video Bronchoscopy with Robotic Assisted Bronchoscopic Navigation   Date of Operation: 12/23/2022   Pre-op Diagnosis: Right lower lobe mass  Post-op Diagnosis: Same  Surgeon: Baltazar Apo  Assistants: None  Anesthesia: General endotracheal anesthesia  Operation: Flexible video fiberoptic bronchoscopy with robotic assistance and biopsies.  Estimated Blood Loss: Minimal  Complications: None  Indications and History: Kari Rodriguez is a 32 y.o. female with history of right lower lobe mass.  She has had previous transbronchial biopsies and brushings that showed granulomatous inflammation.  Recommendation made to pursue tissue diagnosis and culture data via robotic assisted navigational bronchoscopy. The risks, benefits, complications, treatment options and expected outcomes were discussed with the patient.  The possibilities of pneumothorax, pneumonia, reaction to medication, pulmonary aspiration, perforation of a viscus, bleeding, failure to diagnose a condition and creating a complication requiring transfusion or operation were discussed with the patient who freely signed the consent.    Description of Procedure: The patient was seen in the Preoperative Area, was examined and was deemed appropriate to proceed.  The patient was taken to [facility] endoscopy room 3, identified as Kari Rodriguez and the procedure verified as Flexible Video Fiberoptic Bronchoscopy.  A Time Out was held and the above information confirmed.   Prior to the date of the procedure a high-resolution CT scan of the chest was performed. Utilizing ION software program a virtual tracheobronchial tree was generated to allow the creation of distinct navigation pathways to the patient's parenchymal abnormalities. After being taken to the operating room general anesthesia was initiated and the patient  was orally intubated. The video fiberoptic bronchoscope was introduced via the endotracheal tube and a general inspection was  performed which showed normal right and left lung anatomy.  Airways were somewhat narrowed and edematous but no endobronchial lesion seen.  Aspiration of the bilateral mainstems was completed to remove any remaining secretions.  A left upper lobe bronchoalveolar lavage was performed with 60 cc of normal saline instilled and about 15-20 cc returned.  This was sent for microbiology.  Robotic catheter inserted into patient's endotracheal tube.   Target #1 right lower lobe mass: The distinct navigation pathways prepared prior to this procedure were then utilized to navigate to patient's lesion identified on CT scan. The robotic catheter was secured into place and the vision probe was withdrawn.  Lesion location was approximated using fluoroscopy and radial endobronchial ultrasound for peripheral targeting. Under fluoroscopic guidance transbronchial brushings, transbronchial needle biopsies, and transbronchial forceps biopsies were performed to be sent for cytology and pathology. A bronchioalveolar lavage was performed in the right lower lobe adjacent to the nodule and sent for microbiology.    At the end of the procedure a general airway inspection was performed and there was no evidence of active bleeding. The bronchoscope was removed.  The patient tolerated the procedure well. There was no significant blood loss and there were no obvious complications. A post-procedural chest x-ray is pending.  Samples Target #1: 1. Transbronchial brushings from right lower lobe mass 2. Transbronchial Wang needle biopsies from right lower lobe mass 3. Transbronchial forceps biopsies from right lower lobe mass 4. Bronchoalveolar lavage from right lower lobe  Other samples 1. Bronchoalveolar lavage from left upper lobe BAL for microbiology   Plans:  The patient will be discharged from the PACU to home when recovered from anesthesia and after chest x-ray is reviewed. We will review the cytology, pathology and  microbiology results with the patient when they become available. Outpatient followup will be  with Dr Lamonte Sakai.    Baltazar Apo, MD, PhD 12/23/2022, 8:51 AM [facility] [phone] or if no answer before 7:00PM call [phone] For any issues after 7:00PM please call eLink [phone]

## 2022-12-23 NOTE — Discharge Instructions (Signed)
Flexible Bronchoscopy, Care After This sheet gives you information about how to care for yourself after your test. Your doctor may also give you more specific instructions. If you have problems or questions, contact your doctor. Follow these instructions at home: Eating and drinking When your numbness is gone and your cough and gag reflexes have come back, you may: Eat only soft foods. Slowly drink liquids. The day after the test, go back to your normal diet. Driving Do not drive for 24 hours if you were given a medicine to help you relax (sedative). Do not drive or use heavy machinery while taking prescription pain medicine. General instructions  Take over-the-counter and prescription medicines only as told by your doctor. Return to your normal activities as told. Ask what activities are safe for you. Do not use any products that have nicotine or tobacco in them. This includes cigarettes and e-cigarettes. If you need help quitting, ask your doctor. Keep all follow-up visits as told by your doctor. This is important. It is very important if you had a tissue sample (biopsy) taken. Get help right away if: You have shortness of breath that gets worse. You get light-headed. You feel like you are going to pass out (faint). You have chest pain. You cough up: More than a little blood. More blood than before. Summary Do not eat or drink anything (not even water) for 2 hours after your test, or until your numbing medicine wears off. Do not use cigarettes. Do not use e-cigarettes. Get help right away if you have chest pain.  Please call our office for any questions or concerns.  [phone].  This information is not intended to replace advice given to you by your health care provider. Make sure you discuss any questions you have with your health care provider. Document Released: 09/08/2009 Document Revised: 10/24/2017 Document Reviewed: 11/29/2016 Elsevier Patient Education  2020 Elsevier  Inc.  

## 2022-12-23 NOTE — Anesthesia Procedure Notes (Signed)
Procedure Name: Intubation Date/Time: 12/23/2022 7:48 AM  Performed by: Gaylene Brooks, CRNAPre-anesthesia Checklist: Patient identified, Emergency Drugs available, Suction available and Patient being monitored Patient Re-evaluated:Patient Re-evaluated prior to induction Oxygen Delivery Method: Circle System Utilized Preoxygenation: Pre-oxygenation with 100% oxygen Induction Type: IV induction Ventilation: Mask ventilation without difficulty Laryngoscope Size: Miller and 2 Grade View: Grade I Tube type: Oral Tube size: 8.5 mm Number of attempts: 1 Airway Equipment and Method: Stylet and Oral airway Placement Confirmation: ETT inserted through vocal cords under direct vision, positive ETCO2 and breath sounds checked- equal and bilateral Secured at: 23 cm Tube secured with: Tape Dental Injury: Teeth and Oropharynx as per pre-operative assessment

## 2022-12-23 NOTE — Interval H&P Note (Signed)
History and Physical Interval Note:  12/23/2022 7:28 AM  Kari Rodriguez  has presented today for surgery, with the diagnosis of RIGHT LOWER LOBE MASS.  The various methods of treatment have been discussed with the patient and family. After consideration of risks, benefits and other options for treatment, the patient has consented to  Procedure(s): ROBOTIC ASSISTED NAVIGATIONAL BRONCHOSCOPY (Right) as a surgical intervention.  The patient's history has been reviewed, patient examined, no change in status, stable for surgery.  I have reviewed the patient's chart and labs.  Questions were answered to the patient's satisfaction.     Collene Gobble

## 2022-12-24 LAB — ACID FAST SMEAR (AFB, MYCOBACTERIA)
Acid Fast Smear: NEGATIVE
Acid Fast Smear: NEGATIVE

## 2022-12-25 ENCOUNTER — Encounter (HOSPITAL_COMMUNITY): Payer: Self-pay | Admitting: Emergency Medicine

## 2022-12-25 LAB — CULTURE, BAL-QUANTITATIVE W GRAM STAIN
Culture: >=100000 — AB
Culture: NO GROWTH
Gram Stain: NONE SEEN

## 2022-12-25 LAB — CULTURE, RESPIRATORY W GRAM STAIN: Gram Stain: NONE SEEN

## 2022-12-25 LAB — CYTOLOGY - NON PAP

## 2022-12-27 ENCOUNTER — Telehealth: Payer: Self-pay | Admitting: Emergency Medicine

## 2022-12-27 MED ORDER — LEVOFLOXACIN 500 MG PO TABS: 500.0000 mg | ORAL_TABLET | Freq: Every day | ORAL | 0 refills | Status: AC

## 2022-12-27 MED ORDER — FLUCONAZOLE 100 MG PO TABS: 100.0000 mg | ORAL_TABLET | Freq: Every day | ORAL | 0 refills | Status: AC

## 2022-12-27 NOTE — Telephone Encounter (Signed)
Reviewed bronchoscopy results with the patient by phone.  Her cytology is negative.  No granulomas seen this time.  Her bacterial culture has grown out 100 K Moraxella catarrhalis.  The fungal and AFB smears are negative, cultures pending.  At this point given her symptoms I think it would be reasonable to treat her for a Moraxella pneumonia, see if she has clinical improvement and radiographical improvement.  She agrees  Will order levofloxacin 500 mg daily for 10 days Will also give her prescription for fluconazole 100 mg once daily for 3 days because she has trouble with yeast infection when she is on antibiotics.

## 2023-01-09 ENCOUNTER — Ambulatory Visit (INDEPENDENT_AMBULATORY_CARE_PROVIDER_SITE_OTHER): Payer: PRIVATE HEALTH INSURANCE | Admitting: Emergency Medicine

## 2023-01-09 ENCOUNTER — Encounter: Payer: Self-pay | Admitting: Emergency Medicine

## 2023-01-09 VITALS — BP 126/80 | HR 87 | Ht 71.0 in | Wt 310.2 lb

## 2023-01-09 DIAGNOSIS — R918 Other nonspecific abnormal finding of lung field: Secondary | ICD-10-CM

## 2023-01-09 NOTE — Patient Instructions (Addendum)
Your bronchoscopy results did not show any evidence of fungal or mycobacterial infection.  There was evidence for a bacterial pneumonia which we have treated. We will plan to repeat your CT scan of the chest in early April 2024 so we can compare with priors. Follow Dr. Lamonte Sakai in April after your CT so we can review the results together.

## 2023-01-09 NOTE — Progress Notes (Signed)
Subjective:    Patient ID: Kari Rodriguez, female    DOB: [birth year], 32 y.o.   MRN: [id number]  HPI  ROV 04/26/21 --follow-up visit 32 year old woman with history of chronic cough, GERD, chronic rhinitis and a 2 cm rounded right lower lobe soft tissue density on CT chest, not hypermetabolic on PET scan.  She underwent bronchoscopy 04/09/2021.  Cytology without any evidence of malignancy but there were granulomas present, most consistent with sarcoidosis.  She is still dealing with sinus drainage, no cough.    ROV 12/11/22 --follow-up visit for 32 year old woman with a history of obesity, chronic cough, GERD, chronic rhinitis.  I performed bronchoscopy 03/2021 for a rounded right lower lobe soft tissue density that was negative on PET scan.  The biopsies were consistent with granulomatous inflammation, suggestive of sarcoidosis.  She was seen in the emergency department 11/29/2022. She had a URI, then developed chills, R sided shoulder and neck pain. Was seen in the ED. COVID, RSV, flu negative. Took pred x 4 days and the shoulder pain improved., still with a lingering cough. She has also seen a raised red rash on her L shin, now resolving and dark .  She underwent a CT-PA as below.  CT-PA 11/29/2022 reviewed by me shows no mediastinal or hilar adenopathy.  Bilateral bronchial wall thickening.  Right lower lobe soft tissue density 3.3 x 2.9 cm, larger than her prior from 03/2021.  ROV 01/09/2023 --follow-up visit with 32 year old woman with a history of obesity and chronic cough in the setting of GERD and chronic rhinitis.  She has transbronchial biopsies performed to evaluate a rounded right lower lobe soft tissue density.  This showed granulomatous inflammation but no cultures were sent.  It was larger on a repeat CT done 11/29/2022 Bronchoscopy on 12/23/2022 to further evaluate the opacity in the right lower lobe.  There were no granulomas seen, no evidence of malignancy.  The bacterial cultures grew out  Moraxella catarrhalis.  Based on this I treated her with levofloxacin for 10 days.  Her fungal culture is finalized and is negative, AFB smears are all negative, cultures not yet final. Her cough and breathing are both better after the levaquin   Review of Systems As per HPI     Objective:   Physical Exam Vitals:   01/09/23 1425  BP: 126/80  Pulse: 87  SpO2: 99%  Weight: (!) 310 lb 3.2 oz (140.7 kg)  Height: 5' 11"$  (1.803 m)    Gen: Pleasant, obese woman, in no distress,  normal affect  ENT: No lesions,  mouth clear,  oropharynx clear, no postnasal drip  Neck: No JVD, no stridor  Lungs: No use of accessory muscles, distant, no crackles or wheezing on normal respiration, no wheeze on forced expiration  Cardiovascular: RRR, heart sounds normal, no murmur or gallops, no peripheral edema  Musculoskeletal: No deformities, no cyanosis or clubbing  Neuro: alert, awake, non focal  Skin: Warm, no lesions or rash      Assessment & Plan:  Right lower lobe lung mass An enlarging right lower lobe mass that showed granulomatous inflammation on initial transbronchial biopsies 03/2021.  Cultures were not done at that time and we suspected sarcoidosis.  The mass got bigger and we decided to repeat her bronchoscopy 12/23/2022.  AFB and fungal cultures are negative, cytology is negative (including for any granulomatous inflammation) and she grew out Moraxella.  We treated with 10 days of Levaquin.  I will repeat her CT scan of the chest  to look for interval improvement in the right lower lobe opacity.  Question whether she had a pneumonia on top of sarcoidosis or whether this was all pneumonia from the beginning.  If so it was a very indolent process.  I suspect that this was pneumonia on top of a chronic inflammatory disease.    Baltazar Apo, MD, PhD 01/09/2023, 2:42 PM [facility] [phone] or if no answer [phone]

## 2023-01-09 NOTE — Assessment & Plan Note (Signed)
An enlarging right lower lobe mass that showed granulomatous inflammation on initial transbronchial biopsies 03/2021.  Cultures were not done at that time and we suspected sarcoidosis.  The mass got bigger and we decided to repeat her bronchoscopy 12/23/2022.  AFB and fungal cultures are negative, cytology is negative (including for any granulomatous inflammation) and she grew out Moraxella.  We treated with 10 days of Levaquin.  I will repeat her CT scan of the chest to look for interval improvement in the right lower lobe opacity.  Question whether she had a pneumonia on top of sarcoidosis or whether this was all pneumonia from the beginning.  If so it was a very indolent process.  I suspect that this was pneumonia on top of a chronic inflammatory disease.

## 2023-01-22 LAB — FUNGAL ORGANISM REFLEX

## 2023-01-22 LAB — FUNGUS CULTURE RESULT

## 2023-01-22 LAB — FUNGUS CULTURE WITH STAIN

## 2023-02-05 LAB — ACID FAST CULTURE WITH REFLEXED SENSITIVITIES (MYCOBACTERIA)
Acid Fast Culture: NEGATIVE
Acid Fast Culture: NEGATIVE

## 2023-02-26 ENCOUNTER — Ambulatory Visit
Admission: RE | Admit: 2023-02-26 | Discharge: 2023-02-26 | Disposition: A | Discharge status: Home / Self Care | Payer: PRIVATE HEALTH INSURANCE | Source: Ambulatory Visit | Attending: Emergency Medicine | Admitting: Emergency Medicine

## 2023-02-26 DIAGNOSIS — R918 Other nonspecific abnormal finding of lung field: Secondary | ICD-10-CM

## 2023-02-27 ENCOUNTER — Ambulatory Visit (INDEPENDENT_AMBULATORY_CARE_PROVIDER_SITE_OTHER): Payer: PRIVATE HEALTH INSURANCE | Admitting: Emergency Medicine

## 2023-02-27 ENCOUNTER — Encounter: Payer: Self-pay | Admitting: Emergency Medicine

## 2023-02-27 VITALS — BP 124/76 | HR 87 | Temp 98.2°F | Ht 71.0 in | Wt 314.2 lb

## 2023-02-27 DIAGNOSIS — D869 Sarcoidosis, unspecified: Secondary | ICD-10-CM

## 2023-02-27 NOTE — Progress Notes (Signed)
Subjective:    Patient ID: Kari Rodriguez, female    DOB: [birth year], 32 y.o.   MRN: [id number]  HPI  ROV 04/26/21 --follow-up visit 32 year old woman with history of chronic cough, GERD, chronic rhinitis and a 2 cm rounded right lower lobe soft tissue density on CT chest, not hypermetabolic on PET scan.  She underwent bronchoscopy 04/09/2021.  Cytology without any evidence of malignancy but there were granulomas present, most consistent with sarcoidosis.  She is still dealing with sinus drainage, no cough.    ROV 12/11/22 --follow-up visit for 32 year old woman with a history of obesity, chronic cough, GERD, chronic rhinitis.  I performed bronchoscopy 03/2021 for a rounded right lower lobe soft tissue density that was negative on PET scan.  The biopsies were consistent with granulomatous inflammation, suggestive of sarcoidosis.  She was seen in the emergency department 11/29/2022. She had a URI, then developed chills, R sided shoulder and neck pain. Was seen in the ED. COVID, RSV, flu negative. Took pred x 4 days and the shoulder pain improved., still with a lingering cough. She has also seen a raised red rash on her L shin, now resolving and dark .  She underwent a CT-PA as below.  CT-PA 11/29/2022 reviewed by me shows no mediastinal or hilar adenopathy.  Bilateral bronchial wall thickening.  Right lower lobe soft tissue density 3.3 x 2.9 cm, larger than her prior from 03/2021.  ROV 01/09/2023 --follow-up visit with 32 year old woman with a history of obesity and chronic cough in the setting of GERD and chronic rhinitis.  She has transbronchial biopsies performed to evaluate a rounded right lower lobe soft tissue density.  This showed granulomatous inflammation but no cultures were sent.  It was larger on a repeat CT done 11/29/2022 Bronchoscopy on 12/23/2022 to further evaluate the opacity in the right lower lobe.  There were no granulomas seen, no evidence of malignancy.  The bacterial cultures grew out  Moraxella catarrhalis.  Based on this I treated her with levofloxacin for 10 days.  Her fungal culture is finalized and is negative, AFB smears are all negative, cultures not yet final. Her cough and breathing are both better after the levaquin  ROV 02/27/23 --32 year old woman with history of obesity, chronic cough in the setting of GERD, chronic rhinitis.  We have evaluated a rounded right lower lobe soft tissue density with bronchoscopy, initial biopsies 03/2021 showed granulomatous inflammation suggestive of sarcoidosis.  No cultures were done at that time but repeat bronchoscopy 12/23/2022 grew out Moraxella catarrhalis, AFB and fungal cultures are both negative.  CT scan of the chest 02/26/23 reviewed by me shows near complete interval resolution of her right lower lobe masslike opacity with some residual area of scar.  There is some bilateral areas of focal bronchiectasis involving the bilateral lower lobes and lingula.  Also noted was a small endobronchial nodule in the posterior right lower lobe segmental bronchus likely mucous.   Review of Systems As per HPI     Objective:   Physical Exam Vitals:   02/27/23 0835  BP: 124/76  Pulse: 87  Temp: 98.2 F (36.8 C)  TempSrc: Oral  SpO2: 100%  Weight: (!) 314 lb 3.2 oz (142.5 kg)  Height: 5\' 11"  (1.803 m)    Gen: Pleasant, obese woman, in no distress,  normal affect  ENT: No lesions,  mouth clear,  oropharynx clear, no postnasal drip  Neck: No JVD, no stridor  Lungs: No use of accessory muscles, distant, no crackles or  wheezing on normal respiration, no wheeze on forced expiration  Cardiovascular: RRR, heart sounds normal, no murmur or gallops, no peripheral edema  Musculoskeletal: No deformities, no cyanosis or clubbing  Neuro: alert, awake, non focal  Skin: Warm, no lesions or rash      Assessment & Plan:  Sarcoidosis Based on her previous bronchoscopy with some granulomatous lung disease I had presumed that her pulmonary  nodular findings were sarcoidosis.  On her most recent bronchoscopy after expansion of her right lower lobe abnormality there were no granulomas but she grew out Moraxella catarrhalis.  After treatment with antibiotics her right lower lobe mass lesion has almost fully resolved.  The original nodular opacity in that region is clearly smaller.  This may reflect an acute pneumonia superimposed on sarcoid.  At this point I am less confident that sarcoidosis is the unifying diagnosis, question whether she had an indolent smoldering pneumonia for months.  We will continue to do surveillance, repeat her CT chest in 1 year.  She will let me know if she develops new respiratory symptoms, rash, lymphadenopathy.  If so then we will expand the workup sooner  We reviewed your CT scan of the chest today.  There is been significant improvement. We will plan to repeat your CT in April 2025. Please let us know if you develop any changes including new rash, swollen lymph nodes, any new respiratory symptoms.  If so then we may decide to do other workup Please follow Dr. Lamonte Sakai in April 2025 or sooner if needed.  Time spent 30 minutes  Baltazar Apo, MD, PhD 02/27/2023, 9:01 AM [facility] [phone] or if no answer [phone]

## 2023-02-27 NOTE — Addendum Note (Signed)
Addended by: Gavin Potters R on: 02/27/2023 09:03 AM   Modules accepted: Orders

## 2023-02-27 NOTE — Patient Instructions (Signed)
We reviewed your CT scan of the chest today.  There is been significant improvement. We will plan to repeat your CT in April 2025. Please let us know if you develop any changes including new rash, swollen lymph nodes, any new respiratory symptoms.  If so then we may decide to do other workup Please follow Dr. Lamonte Sakai in April 2025 or sooner if needed.

## 2023-02-27 NOTE — Assessment & Plan Note (Signed)
Based on her previous bronchoscopy with some granulomatous lung disease I had presumed that her pulmonary nodular findings were sarcoidosis.  On her most recent bronchoscopy after expansion of her right lower lobe abnormality there were no granulomas but she grew out Moraxella catarrhalis.  After treatment with antibiotics her right lower lobe mass lesion has almost fully resolved.  The original nodular opacity in that region is clearly smaller.  This may reflect an acute pneumonia superimposed on sarcoid.  At this point I am less confident that sarcoidosis is the unifying diagnosis, question whether she had an indolent smoldering pneumonia for months.  We will continue to do surveillance, repeat her CT chest in 1 year.  She will let me know if she develops new respiratory symptoms, rash, lymphadenopathy.  If so then we will expand the workup sooner  We reviewed your CT scan of the chest today.  There is been significant improvement. We will plan to repeat your CT in April 2025. Please let us know if you develop any changes including new rash, swollen lymph nodes, any new respiratory symptoms.  If so then we may decide to do other workup Please follow Dr. Lamonte Sakai in April 2025 or sooner if needed.

## 2023-08-08 ENCOUNTER — Other Ambulatory Visit: Payer: Self-pay

## 2023-08-08 DIAGNOSIS — J0110 Acute frontal sinusitis, unspecified: Secondary | ICD-10-CM | POA: Insufficient documentation

## 2023-08-08 DIAGNOSIS — Z8616 Personal history of COVID-19: Secondary | ICD-10-CM | POA: Insufficient documentation

## 2023-08-09 ENCOUNTER — Emergency Department (HOSPITAL_BASED_OUTPATIENT_CLINIC_OR_DEPARTMENT_OTHER): Payer: PRIVATE HEALTH INSURANCE

## 2023-08-09 ENCOUNTER — Encounter (HOSPITAL_BASED_OUTPATIENT_CLINIC_OR_DEPARTMENT_OTHER): Payer: Self-pay

## 2023-08-09 ENCOUNTER — Emergency Department (HOSPITAL_BASED_OUTPATIENT_CLINIC_OR_DEPARTMENT_OTHER)
Admission: EM | Admit: 2023-08-09 | Discharge: 2023-08-09 | Disposition: A | Discharge status: Home / Self Care | Payer: PRIVATE HEALTH INSURANCE | Attending: Emergency Medicine | Admitting: Emergency Medicine

## 2023-08-09 DIAGNOSIS — J0110 Acute frontal sinusitis, unspecified: Secondary | ICD-10-CM

## 2023-08-09 LAB — COMPREHENSIVE METABOLIC PANEL
ALT: 13 U/L (ref 0–44)
AST: 15 U/L (ref 15–41)
Albumin: 4.1 g/dL (ref 3.5–5.0)
Alkaline Phosphatase: 71 U/L (ref 38–126)
Anion gap: 9 (ref 5–15)
BUN: 9 mg/dL (ref 6–20)
CO2: 25 mmol/L (ref 22–32)
Calcium: 9.2 mg/dL (ref 8.9–10.3)
Chloride: 103 mmol/L (ref 98–111)
Creatinine, Ser: 0.92 mg/dL (ref 0.44–1.00)
GFR, Estimated: >60 mL/min (ref >60)
Glucose, Bld: 114 mg/dL — ABNORMAL HIGH (ref 70–99)
Potassium: 3.5 mmol/L (ref 3.5–5.1)
Sodium: 137 mmol/L (ref 135–145)
Total Bilirubin: 0.5 mg/dL (ref 0.3–1.2)
Total Protein: 8.1 g/dL (ref 6.5–8.1)

## 2023-08-09 LAB — CBC WITH DIFFERENTIAL/PLATELET
Abs Immature Granulocytes: 0.09 10*3/uL — ABNORMAL HIGH (ref 0.00–0.07)
Basophils Absolute: 0.1 10*3/uL (ref 0.0–0.1)
Basophils Relative: 1 %
Eosinophils Absolute: 0.1 10*3/uL (ref 0.0–0.5)
Eosinophils Relative: 1 %
HCT: 36.3 % (ref 36.0–46.0)
Hemoglobin: 12 g/dL (ref 12.0–15.0)
Immature Granulocytes: 1 %
Lymphocytes Relative: 13 %
Lymphs Abs: 2.3 10*3/uL (ref 0.7–4.0)
MCH: 27.8 pg (ref 26.0–34.0)
MCHC: 33.1 g/dL (ref 30.0–36.0)
MCV: 84.2 fL (ref 80.0–100.0)
Monocytes Absolute: 1.6 10*3/uL — ABNORMAL HIGH (ref 0.1–1.0)
Monocytes Relative: 9 %
Neutro Abs: 13.7 10*3/uL — ABNORMAL HIGH (ref 1.7–7.7)
Neutrophils Relative %: 75 %
Platelets: 375 10*3/uL (ref 150–400)
RBC: 4.31 MIL/uL (ref 3.87–5.11)
RDW: 13.4 % (ref 11.5–15.5)
WBC: 17.8 10*3/uL — ABNORMAL HIGH (ref 4.0–10.5)
nRBC: 0 % (ref 0.0–0.2)

## 2023-08-09 LAB — URINALYSIS, W/ REFLEX TO CULTURE (INFECTION SUSPECTED)
Bilirubin Urine: NEGATIVE
Glucose, UA: NEGATIVE mg/dL
Hgb urine dipstick: NEGATIVE
Ketones, ur: NEGATIVE mg/dL
Leukocytes,Ua: NEGATIVE
Nitrite: NEGATIVE
Protein, ur: NEGATIVE mg/dL
Specific Gravity, Urine: 1.018 (ref 1.005–1.030)
pH: 7 (ref 5.0–8.0)

## 2023-08-09 LAB — PREGNANCY, URINE: Preg Test, Ur: NEGATIVE

## 2023-08-09 LAB — LACTIC ACID, PLASMA: Lactic Acid, Venous: 0.9 mmol/L (ref 0.5–1.9)

## 2023-08-09 MED ORDER — SODIUM CHLORIDE 0.9 % IV SOLN
100.0000 mg | Freq: Once | INTRAVENOUS | Status: AC
  Administered 2023-08-09: 100 mg via INTRAVENOUS
  Filled 2023-08-09: qty 100

## 2023-08-09 MED ORDER — LACTATED RINGERS IV BOLUS
1000.0000 mL | Freq: Once | INTRAVENOUS | Status: AC
  Administered 2023-08-09: 1000 mL via INTRAVENOUS

## 2023-08-09 MED ORDER — ACETAMINOPHEN 500 MG PO TABS
1000.0000 mg | ORAL_TABLET | Freq: Once | ORAL | Status: AC
  Administered 2023-08-09: 1000 mg via ORAL
  Filled 2023-08-09: qty 2

## 2023-08-09 MED ORDER — DOXYCYCLINE HYCLATE 100 MG PO CAPS: 100.0000 mg | ORAL_CAPSULE | Freq: Two times a day (BID) | ORAL | 0 refills | Status: DC
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
## 2023-08-09 NOTE — ED Triage Notes (Signed)
Pov from home, A&O x 4, GCS 15, amb to triage  Pt c/o sore throat, productive cough and chills x 4 days, had cough since covid and PNA in August, just getting worse.

## 2023-08-25 DIAGNOSIS — Z3041 Encounter for surveillance of contraceptive pills: Secondary | ICD-10-CM | POA: Diagnosis not present

## 2023-09-02 ENCOUNTER — Ambulatory Visit
Admission: RE | Admit: 2023-09-02 | Discharge: 2023-09-02 | Disposition: A | Discharge status: Home / Self Care | Payer: BC Managed Care – PPO | Source: Ambulatory Visit | Attending: Family Medicine | Admitting: Family Medicine

## 2023-09-02 ENCOUNTER — Other Ambulatory Visit: Payer: Self-pay | Admitting: Family Medicine

## 2023-09-02 DIAGNOSIS — R052 Subacute cough: Secondary | ICD-10-CM

## 2023-09-02 DIAGNOSIS — R062 Wheezing: Secondary | ICD-10-CM | POA: Diagnosis not present

## 2023-12-25 ENCOUNTER — Other Ambulatory Visit (HOSPITAL_COMMUNITY)
Admission: RE | Admit: 2023-12-25 | Discharge: 2023-12-25 | Disposition: A | Discharge status: Home / Self Care | Payer: BC Managed Care – PPO | Source: Ambulatory Visit | Attending: Nurse Practitioner | Admitting: Nurse Practitioner

## 2023-12-25 ENCOUNTER — Other Ambulatory Visit: Payer: Self-pay | Admitting: Nurse Practitioner

## 2023-12-25 DIAGNOSIS — Z124 Encounter for screening for malignant neoplasm of cervix: Secondary | ICD-10-CM | POA: Insufficient documentation

## 2023-12-25 DIAGNOSIS — Z7251 High risk heterosexual behavior: Secondary | ICD-10-CM | POA: Diagnosis not present

## 2023-12-25 DIAGNOSIS — Z01419 Encounter for gynecological examination (general) (routine) without abnormal findings: Secondary | ICD-10-CM | POA: Diagnosis not present

## 2023-12-25 DIAGNOSIS — N898 Other specified noninflammatory disorders of vagina: Secondary | ICD-10-CM | POA: Diagnosis not present

## 2023-12-29 LAB — CYTOLOGY - PAP
Comment: NEGATIVE
Diagnosis: NEGATIVE
High risk HPV: NEGATIVE

## 2024-02-02 ENCOUNTER — Telehealth: Payer: Self-pay | Admitting: Emergency Medicine

## 2024-02-02 NOTE — Telephone Encounter (Signed)
 PT now wants to sched CT scan. See notes on "Activity Tab" for more details. Initialed by "AB"

## 2024-02-03 NOTE — Telephone Encounter (Signed)
 Transferred patient over to [facility] to get scheduled that is who called her

## 2024-02-09 ENCOUNTER — Encounter: Payer: Self-pay | Admitting: Emergency Medicine

## 2024-02-10 ENCOUNTER — Encounter: Payer: Self-pay | Admitting: Emergency Medicine

## 2024-02-27 ENCOUNTER — Ambulatory Visit
Admission: RE | Admit: 2024-02-27 | Discharge: 2024-02-27 | Disposition: A | Discharge status: Home / Self Care | Source: Ambulatory Visit | Attending: Emergency Medicine | Admitting: Emergency Medicine

## 2024-02-27 DIAGNOSIS — J479 Bronchiectasis, uncomplicated: Secondary | ICD-10-CM | POA: Diagnosis not present

## 2024-02-27 DIAGNOSIS — J9811 Atelectasis: Secondary | ICD-10-CM | POA: Diagnosis not present

## 2024-02-27 DIAGNOSIS — D869 Sarcoidosis, unspecified: Secondary | ICD-10-CM

## 2024-03-19 ENCOUNTER — Ambulatory Visit: Admitting: Adult Health

## 2024-03-19 ENCOUNTER — Encounter: Payer: Self-pay | Admitting: Adult Health

## 2024-03-19 VITALS — BP 124/82 | HR 64 | Ht 70.0 in | Wt 322.4 lb

## 2024-03-19 DIAGNOSIS — R053 Chronic cough: Secondary | ICD-10-CM

## 2024-03-19 DIAGNOSIS — J209 Acute bronchitis, unspecified: Secondary | ICD-10-CM

## 2024-03-19 DIAGNOSIS — J470 Bronchiectasis with acute lower respiratory infection: Secondary | ICD-10-CM

## 2024-03-19 DIAGNOSIS — R093 Abnormal sputum: Secondary | ICD-10-CM | POA: Diagnosis not present

## 2024-03-19 DIAGNOSIS — J479 Bronchiectasis, uncomplicated: Secondary | ICD-10-CM

## 2024-03-19 DIAGNOSIS — R059 Cough, unspecified: Secondary | ICD-10-CM

## 2024-03-19 DIAGNOSIS — J1569 Pneumonia due to other gram-negative bacteria: Secondary | ICD-10-CM

## 2024-03-19 DIAGNOSIS — R0683 Snoring: Secondary | ICD-10-CM

## 2024-03-19 LAB — CBC WITH DIFFERENTIAL/PLATELET
Basophils Absolute: 0.1 10*3/uL (ref 0.0–0.1)
Basophils Relative: 1.1 % (ref 0.0–3.0)
Eosinophils Absolute: 0.2 10*3/uL (ref 0.0–0.7)
Eosinophils Relative: 3.1 % (ref 0.0–5.0)
HCT: 37.9 % (ref 36.0–46.0)
Hemoglobin: 12.5 g/dL (ref 12.0–15.0)
Lymphocytes Relative: 36.6 % (ref 12.0–46.0)
Lymphs Abs: 2.8 10*3/uL (ref 0.7–4.0)
MCHC: 32.9 g/dL (ref 30.0–36.0)
MCV: 85.4 fl (ref 78.0–100.0)
Monocytes Absolute: 0.7 10*3/uL (ref 0.1–1.0)
Monocytes Relative: 8.9 % (ref 3.0–12.0)
Neutro Abs: 3.8 10*3/uL (ref 1.4–7.7)
Neutrophils Relative %: 50.3 % (ref 43.0–77.0)
Platelets: 430 10*3/uL — ABNORMAL HIGH (ref 150.0–400.0)
RBC: 4.44 Mil/uL (ref 3.87–5.11)
RDW: 14.4 % (ref 11.5–15.5)
WBC: 7.6 10*3/uL (ref 4.0–10.5)

## 2024-03-19 MED ORDER — BENZONATATE 200 MG PO CAPS: 200.0000 mg | ORAL_CAPSULE | Freq: Three times a day (TID) | ORAL | 1 refills | Status: AC | PRN

## 2024-03-19 MED ORDER — AZITHROMYCIN 250 MG PO TABS: ORAL_TABLET | ORAL | 0 refills | Status: AC
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
## 2024-03-19 NOTE — Patient Instructions (Addendum)
 Set up for home sleep study.   Zpack take as directed.  Begin Zyrtec 10mg  daily.  Liquid Mucinex DM Twice daily  As needed  cough/congestion  Add Flutter valve Twice daily   Tessalon  Three times a day As needed  cough Albuterol inhaler As needed  cough/wheezing   Labs today   Follow up with Dentist as discussed   Follow up with Dr. Baldwin Levee  or Shaunn Tackitt NP in 6 weeks with PFT and As needed

## 2024-03-24 LAB — RESPIRATORY ALLERGY PROFILE REGION II ~~LOC~~

## 2024-03-24 LAB — IGG, IGA, IGM
IgG (Immunoglobin G), Serum: 1522 mg/dL (ref 600–1640)
IgM, Serum: 107 mg/dL (ref 50–300)
Immunoglobulin A: 533 mg/dL — ABNORMAL HIGH (ref 47–310)

## 2024-03-24 LAB — INTERPRETATION:

## 2024-03-24 LAB — RESPIRATORY CULTURE OR RESPIRATORY AND SPUTUM CULTURE

## 2024-03-26 ENCOUNTER — Encounter

## 2024-03-26 DIAGNOSIS — G4730 Sleep apnea, unspecified: Secondary | ICD-10-CM | POA: Diagnosis not present

## 2024-03-26 DIAGNOSIS — R0683 Snoring: Secondary | ICD-10-CM

## 2024-04-05 DIAGNOSIS — R0683 Snoring: Secondary | ICD-10-CM | POA: Diagnosis not present

## 2024-04-06 DIAGNOSIS — E559 Vitamin D deficiency, unspecified: Secondary | ICD-10-CM | POA: Diagnosis not present

## 2024-04-06 DIAGNOSIS — E8889 Other specified metabolic disorders: Secondary | ICD-10-CM | POA: Diagnosis not present

## 2024-04-06 DIAGNOSIS — E7800 Pure hypercholesterolemia, unspecified: Secondary | ICD-10-CM | POA: Diagnosis not present

## 2024-04-06 DIAGNOSIS — R7303 Prediabetes: Secondary | ICD-10-CM | POA: Diagnosis not present

## 2024-04-06 DIAGNOSIS — Z1331 Encounter for screening for depression: Secondary | ICD-10-CM | POA: Diagnosis not present

## 2024-04-06 DIAGNOSIS — G4719 Other hypersomnia: Secondary | ICD-10-CM | POA: Diagnosis not present

## 2024-04-25 DIAGNOSIS — N760 Acute vaginitis: Secondary | ICD-10-CM | POA: Diagnosis not present

## 2024-05-11 DIAGNOSIS — N764 Abscess of vulva: Secondary | ICD-10-CM | POA: Diagnosis not present

## 2024-05-11 DIAGNOSIS — N760 Acute vaginitis: Secondary | ICD-10-CM | POA: Diagnosis not present

## 2024-05-11 DIAGNOSIS — N898 Other specified noninflammatory disorders of vagina: Secondary | ICD-10-CM | POA: Diagnosis not present

## 2024-05-13 DIAGNOSIS — R300 Dysuria: Secondary | ICD-10-CM | POA: Diagnosis not present

## 2024-05-17 ENCOUNTER — Encounter: Payer: Self-pay | Admitting: Adult Health

## 2024-05-17 ENCOUNTER — Ambulatory Visit (HOSPITAL_BASED_OUTPATIENT_CLINIC_OR_DEPARTMENT_OTHER): Admitting: Emergency Medicine

## 2024-05-17 ENCOUNTER — Ambulatory Visit (INDEPENDENT_AMBULATORY_CARE_PROVIDER_SITE_OTHER): Admitting: Adult Health

## 2024-05-17 VITALS — BP 110/70 | HR 87 | Temp 98.2°F | Ht 71.0 in | Wt 318.2 lb

## 2024-05-17 DIAGNOSIS — J479 Bronchiectasis, uncomplicated: Secondary | ICD-10-CM

## 2024-05-17 DIAGNOSIS — R053 Chronic cough: Secondary | ICD-10-CM

## 2024-05-17 DIAGNOSIS — J209 Acute bronchitis, unspecified: Secondary | ICD-10-CM

## 2024-05-17 LAB — PULMONARY FUNCTION TEST
DL/VA % pred: 117 %
DL/VA: 5.03 ml/min/mmHg/L
DLCO cor % pred: 96 %
DLCO cor: 26.91 ml/min/mmHg
DLCO unc % pred: 96 %
DLCO unc: 26.91 ml/min/mmHg
FEF 25-75 Post: 3.92 L/s
FEF 25-75 Pre: 2.71 L/s
FEF2575-%Change-Post: 44 %
FEF2575-%Pred-Post: 105 %
FEF2575-%Pred-Pre: 72 %
FEV1-%Change-Post: 12 %
FEV1-%Pred-Post: 92 %
FEV1-%Pred-Pre: 82 %
FEV1-Post: 3.53 L
FEV1-Pre: 3.15 L
FEV1FVC-%Change-Post: 4 %
FEV1FVC-%Pred-Pre: 92 %
FEV6-%Change-Post: 7 %
FEV6-%Pred-Post: 95 %
FEV6-%Pred-Pre: 89 %
FEV6-Post: 4.39 L
FEV6-Pre: 4.09 L
FEV6FVC-%Pred-Post: 101 %
FEV6FVC-%Pred-Pre: 101 %
FVC-%Change-Post: 7 %
FVC-%Pred-Post: 94 %
FVC-%Pred-Pre: 88 %
FVC-Post: 4.39 L
FVC-Pre: 4.09 L
Post FEV1/FVC ratio: 80 %
Post FEV6/FVC ratio: 100 %
Pre FEV1/FVC ratio: 77 %
Pre FEV6/FVC Ratio: 100 %
RV % pred: 90 %
RV: 1.67 L
TLC % pred: 95 %
TLC: 5.84 L

## 2024-05-17 MED ORDER — ALBUTEROL SULFATE HFA 108 (90 BASE) MCG/ACT IN AERS: 1.0000 | INHALATION_SPRAY | Freq: Four times a day (QID) | RESPIRATORY_TRACT | 2 refills | Status: AC | PRN

## 2024-05-17 NOTE — Progress Notes (Signed)
 Full PFT performed today.

## 2024-05-17 NOTE — Patient Instructions (Addendum)
 Begin Zyrtec 10mg  daily.  Liquid Mucinex DM Twice daily  As needed  cough/congestion  Flutter valve Twice daily   Tessalon  Three times a day As needed  cough Albuterol inhaler As needed  cough/wheezing  Follow up with Dr. Byrum  or Inika Bellanger NP in 6 months and As needed

## 2024-05-17 NOTE — Patient Instructions (Signed)
 Full PFT performed today.
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
## 2024-05-17 NOTE — Progress Notes (Unsigned)
 Patient ID: Kari Rodriguez , female   DOB: 04/24/1991, 33 y.o.   MRN: [id number]

## 2024-05-18 NOTE — Progress Notes (Unsigned)
Patient seen in the office today and instructed on use of albuterol.  Patient expressed understanding and demonstrated technique.  

## 2024-06-04 DIAGNOSIS — B3731 Acute candidiasis of vulva and vagina: Secondary | ICD-10-CM | POA: Diagnosis not present

## 2024-08-11 DIAGNOSIS — J0110 Acute frontal sinusitis, unspecified: Secondary | ICD-10-CM | POA: Diagnosis not present

## 2024-09-23 DIAGNOSIS — N898 Other specified noninflammatory disorders of vagina: Secondary | ICD-10-CM | POA: Diagnosis not present

## 2024-10-14 DIAGNOSIS — K5900 Constipation, unspecified: Secondary | ICD-10-CM | POA: Diagnosis not present

## 2024-10-14 DIAGNOSIS — R635 Abnormal weight gain: Secondary | ICD-10-CM | POA: Diagnosis not present

## 2024-11-16 ENCOUNTER — Ambulatory Visit: Admitting: Adult Health

## 2024-12-10 ENCOUNTER — Other Ambulatory Visit: Payer: Self-pay | Admitting: Gastroenterology

## 2024-12-10 DIAGNOSIS — K5900 Constipation, unspecified: Secondary | ICD-10-CM

## 2024-12-10 DIAGNOSIS — R1084 Generalized abdominal pain: Secondary | ICD-10-CM

## 2024-12-21 ENCOUNTER — Other Ambulatory Visit

## 2024-12-24 ENCOUNTER — Ambulatory Visit
Admission: RE | Admit: 2024-12-24 | Discharge: 2024-12-24 | Disposition: A | Source: Ambulatory Visit | Attending: Gastroenterology

## 2024-12-24 DIAGNOSIS — R1084 Generalized abdominal pain: Secondary | ICD-10-CM

## 2024-12-24 DIAGNOSIS — K5900 Constipation, unspecified: Secondary | ICD-10-CM

## 2024-12-24 MED ORDER — IOPAMIDOL (ISOVUE-300) INJECTION 61%
100.0000 mL | Freq: Once | INTRAVENOUS | Status: AC | PRN
  Administered 2024-12-24: 100 mL via INTRAVENOUS
